# Patient Record
Sex: Male | Born: 1975 | Race: White | Hispanic: No | Marital: Married | Smoking: Never smoker
Health system: Southern US, Community
[De-identification: ages and names within clinical notes are randomized; demographics above are authoritative.]

## PROBLEM LIST (undated history)

## (undated) DIAGNOSIS — F1020 Alcohol dependence, uncomplicated: Secondary | ICD-10-CM

---

## 2007-04-29 ENCOUNTER — Encounter: Admission: RE | Admit: 2007-04-29 | Discharge: 2007-04-29 | Payer: Self-pay | Admitting: Gastroenterology

## 2007-06-03 ENCOUNTER — Encounter: Admission: RE | Admit: 2007-06-03 | Discharge: 2007-06-03 | Payer: Self-pay | Admitting: Gastroenterology

## 2007-06-23 ENCOUNTER — Ambulatory Visit (HOSPITAL_COMMUNITY): Admission: RE | Admit: 2007-06-23 | Discharge: 2007-06-23 | Payer: Self-pay | Admitting: Urology

## 2007-07-27 ENCOUNTER — Encounter: Admission: RE | Admit: 2007-07-27 | Discharge: 2007-10-25 | Payer: Self-pay | Admitting: Family Medicine

## 2010-08-12 ENCOUNTER — Encounter: Payer: Self-pay | Admitting: Gastroenterology

## 2011-04-29 LAB — CREATININE, SERUM
Creatinine, Ser: 1.06
GFR calc Af Amer: >60
GFR calc non Af Amer: >60

## 2014-04-14 ENCOUNTER — Other Ambulatory Visit: Payer: Self-pay | Admitting: Otolaryngology

## 2014-04-14 DIAGNOSIS — K219 Gastro-esophageal reflux disease without esophagitis: Principal | ICD-10-CM

## 2014-04-14 DIAGNOSIS — IMO0001 Reserved for inherently not codable concepts without codable children: Secondary | ICD-10-CM

## 2014-04-20 ENCOUNTER — Other Ambulatory Visit: Payer: Self-pay

## 2014-04-21 ENCOUNTER — Ambulatory Visit
Admission: RE | Admit: 2014-04-21 | Discharge: 2014-04-21 | Disposition: A | Discharge status: Home / Self Care | Payer: Federal, State, Local not specified - PPO | Source: Ambulatory Visit | Attending: Otolaryngology | Admitting: Otolaryngology

## 2014-04-21 DIAGNOSIS — IMO0001 Reserved for inherently not codable concepts without codable children: Secondary | ICD-10-CM

## 2014-04-21 DIAGNOSIS — K219 Gastro-esophageal reflux disease without esophagitis: Principal | ICD-10-CM

## 2014-05-13 ENCOUNTER — Other Ambulatory Visit: Payer: Self-pay | Admitting: Otolaryngology

## 2014-05-13 DIAGNOSIS — R070 Pain in throat: Secondary | ICD-10-CM

## 2014-05-18 ENCOUNTER — Ambulatory Visit
Admission: RE | Admit: 2014-05-18 | Discharge: 2014-05-18 | Disposition: A | Discharge status: Home / Self Care | Payer: Federal, State, Local not specified - PPO | Source: Ambulatory Visit | Attending: Otolaryngology | Admitting: Otolaryngology

## 2014-05-18 DIAGNOSIS — R070 Pain in throat: Secondary | ICD-10-CM

## 2014-05-18 MED ORDER — IOHEXOL 300 MG/ML  SOLN
75.0000 mL | Freq: Once | INTRAMUSCULAR | Status: AC | PRN
  Administered 2014-05-18: 75 mL via INTRAVENOUS

## 2015-02-05 IMAGING — CT CT NECK W/ CM
4 of 6 series · 14 of 33 positions shown, 16 images · IV contrast (75ML OF ISOVUE)
Comparison: None.

CLINICAL DATA: Possible mass. Abnormal barium swallow. Area of
concern marked with vitamin-E capsule.

EXAM:
CT NECK WITH CONTRAST
TECHNIQUE: Multidetector CT imaging of the neck was performed using the
standard protocol following the bolus administration of intravenous
contrast.
CONTRAST:  75mL OMNIPAQUE IOHEXOL 300 MG/ML  SOLN

[Series 3: axial neck · axial · 0.45mm/px · z∈[+43,+133]mm · 2 of 109 slices shown]
[im 37/109  bone]
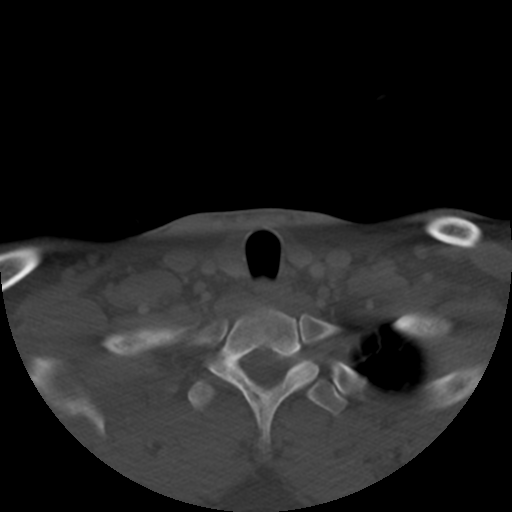
[im 73/109  bone]
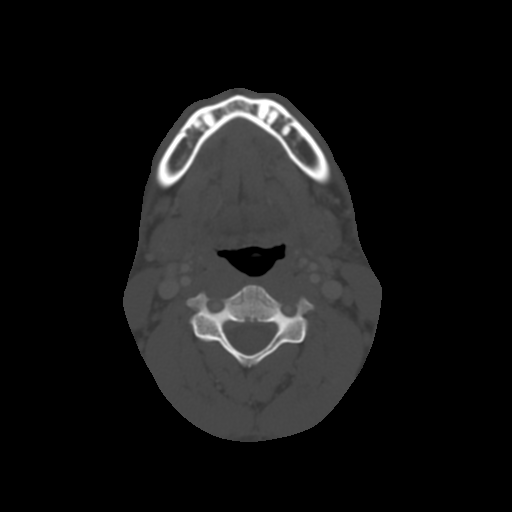

[Series 200: cor · coronal · 0.54mm/px · 3 of 95 slices shown]
[im 19/95  bone]
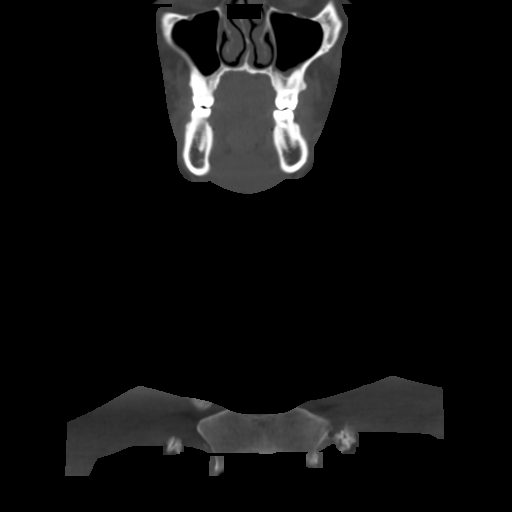
[im 38/95  bone]
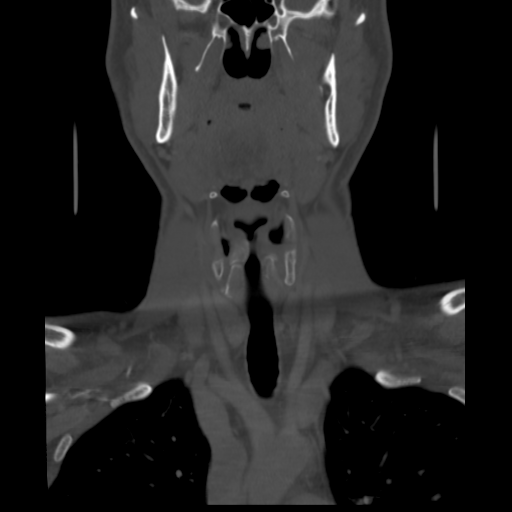
[im 57/95  bone]
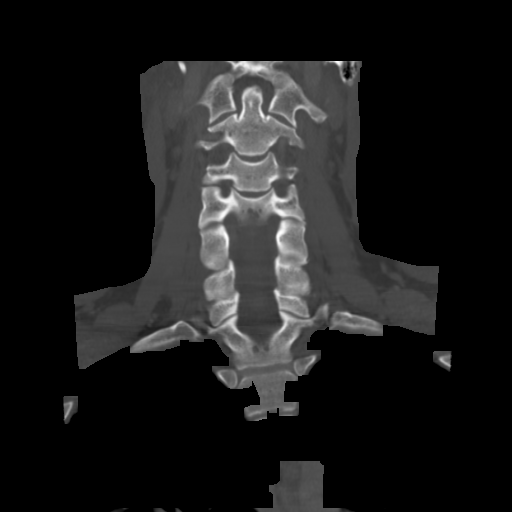

[Series 201: axial · axial · 0.54mm/px · z∈[-31,+142]mm · 4 of 151 slices shown, 5 images]
[im 31/151  soft-tissue]
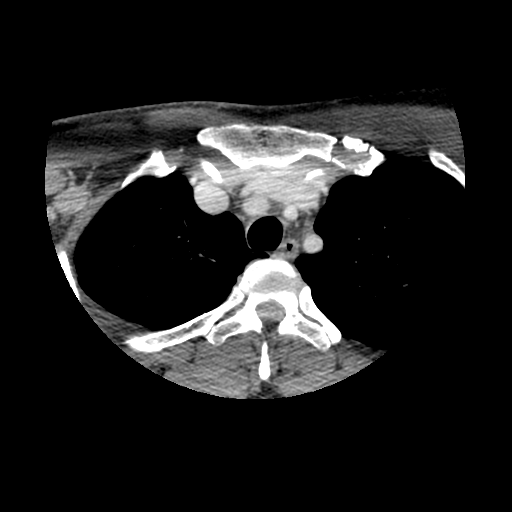
[im 31/151  bone]
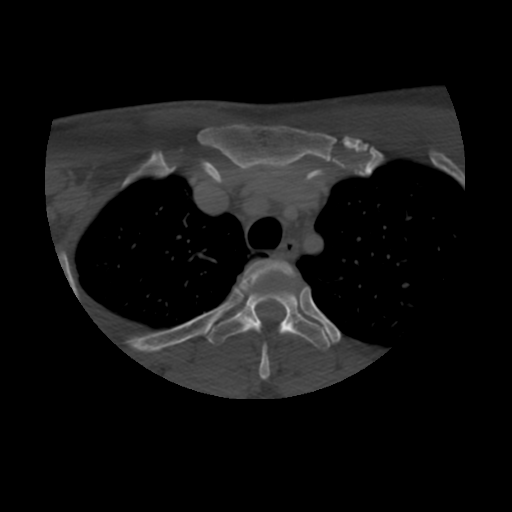
[im 61/151  bone]
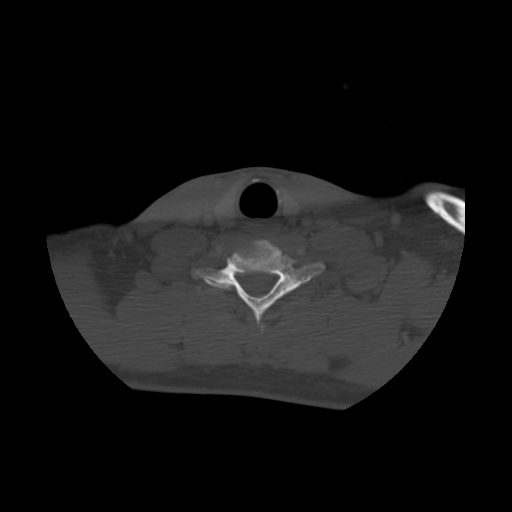
[im 91/151  bone]
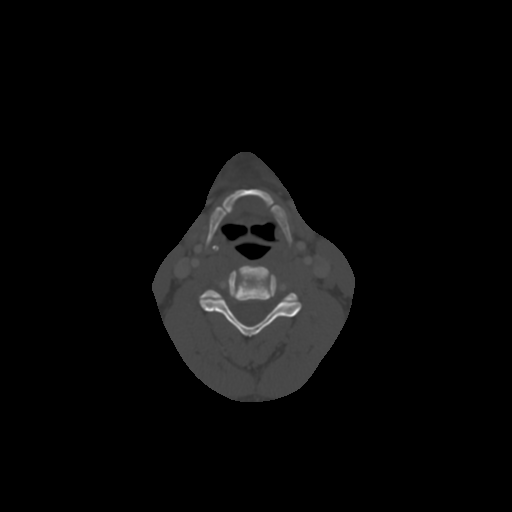
[im 121/151  bone]
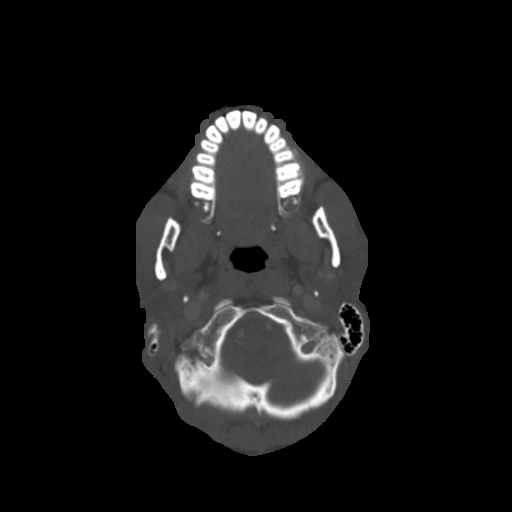

[Series 202: sag · sagittal · 0.54mm/px · 5 of 89 slices shown, 6 images]
[im 30/89  bone]
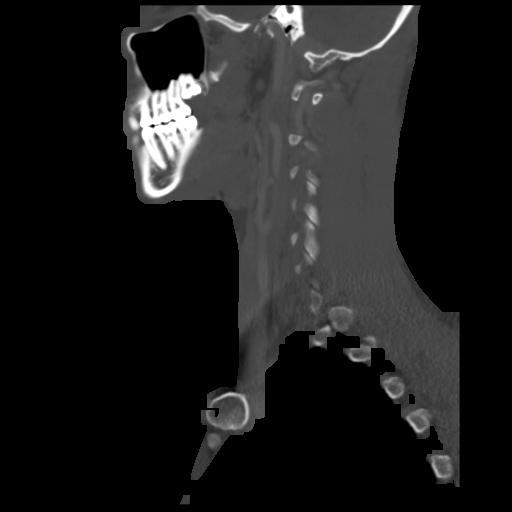
[im 37/89  bone]
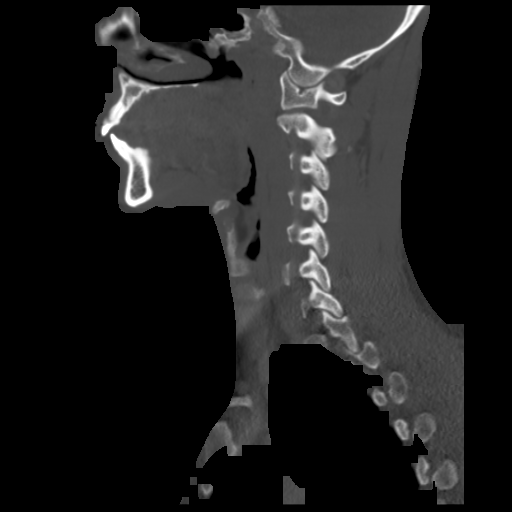
[im 45/89  soft-tissue]
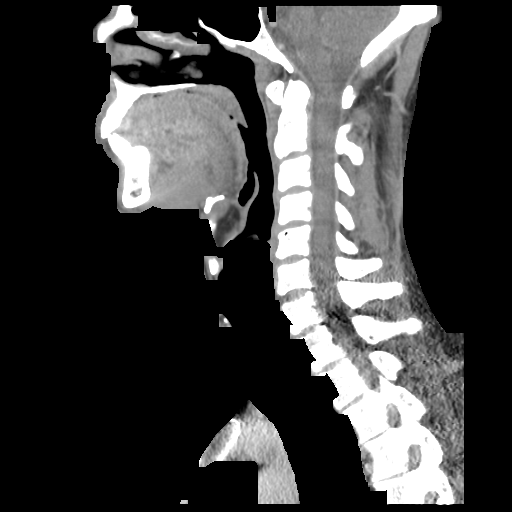
[im 45/89  bone]
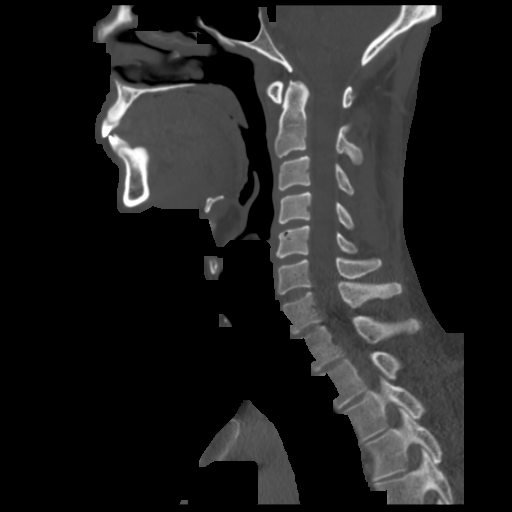
[im 52/89  bone]
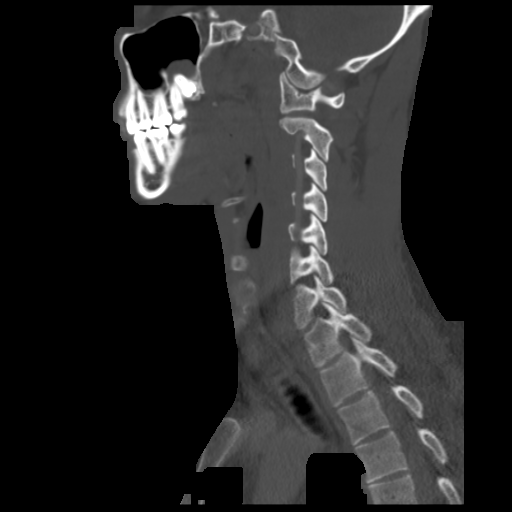
[im 59/89  bone]
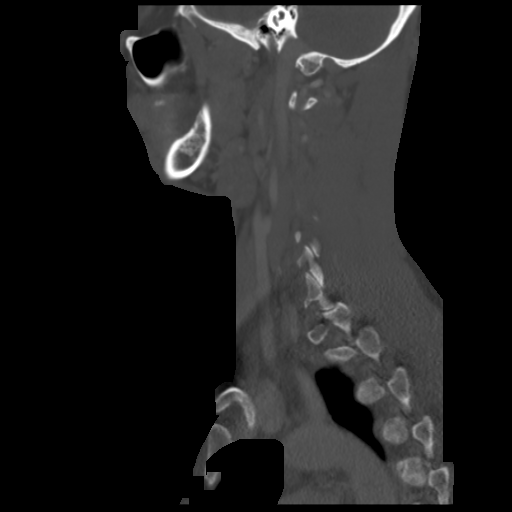

[14 of 33 positions shown; findings below may reference images not displayed]

FINDINGS: Suprahyoid neck:  Normal.

Larynx:  Normal.

Infrahyoid neck:  Normal.

Lymph nodes:  No pathologic adenopathy.

Upper chest/mediastinum:  Negative.

Additional: Cervical spine unremarkable. No vascular anomaly. No
osseous lesion. Chronic sinus disease.
IMPRESSION: Unremarkable CT neck. No abnormality is seen which might explain a
defect in the esophageal contour on barium swallow.

## 2017-06-17 DIAGNOSIS — J302 Other seasonal allergic rhinitis: Secondary | ICD-10-CM | POA: Diagnosis not present

## 2017-06-17 DIAGNOSIS — J4521 Mild intermittent asthma with (acute) exacerbation: Secondary | ICD-10-CM | POA: Diagnosis not present

## 2017-06-17 DIAGNOSIS — J018 Other acute sinusitis: Secondary | ICD-10-CM | POA: Diagnosis not present

## 2017-06-17 DIAGNOSIS — R05 Cough: Secondary | ICD-10-CM | POA: Diagnosis not present

## 2018-08-24 DIAGNOSIS — M545 Low back pain: Secondary | ICD-10-CM | POA: Diagnosis not present

## 2018-08-26 DIAGNOSIS — M5416 Radiculopathy, lumbar region: Secondary | ICD-10-CM | POA: Diagnosis not present

## 2018-09-17 DIAGNOSIS — M545 Low back pain: Secondary | ICD-10-CM | POA: Diagnosis not present

## 2018-12-25 DIAGNOSIS — Z0000 Encounter for general adult medical examination without abnormal findings: Secondary | ICD-10-CM | POA: Diagnosis not present

## 2019-01-26 DIAGNOSIS — Z13220 Encounter for screening for lipoid disorders: Secondary | ICD-10-CM | POA: Diagnosis not present

## 2019-01-26 DIAGNOSIS — Z0000 Encounter for general adult medical examination without abnormal findings: Secondary | ICD-10-CM | POA: Diagnosis not present

## 2019-01-26 DIAGNOSIS — Z23 Encounter for immunization: Secondary | ICD-10-CM | POA: Diagnosis not present

## 2020-02-25 DIAGNOSIS — Z20822 Contact with and (suspected) exposure to covid-19: Secondary | ICD-10-CM | POA: Diagnosis not present

## 2020-03-06 ENCOUNTER — Encounter (HOSPITAL_COMMUNITY): Payer: Self-pay

## 2020-03-06 ENCOUNTER — Inpatient Hospital Stay (HOSPITAL_COMMUNITY)
Admission: EM | Admit: 2020-03-06 | Discharge: 2020-03-10 | DRG: 177 | Disposition: A | Discharge status: Home / Self Care | Payer: Federal, State, Local not specified - PPO | Attending: Internal Medicine | Admitting: Internal Medicine

## 2020-03-06 ENCOUNTER — Other Ambulatory Visit: Payer: Self-pay

## 2020-03-06 ENCOUNTER — Emergency Department (HOSPITAL_COMMUNITY): Payer: Federal, State, Local not specified - PPO

## 2020-03-06 DIAGNOSIS — J9601 Acute respiratory failure with hypoxia: Secondary | ICD-10-CM | POA: Diagnosis present

## 2020-03-06 DIAGNOSIS — Z881 Allergy status to other antibiotic agents status: Secondary | ICD-10-CM

## 2020-03-06 DIAGNOSIS — Z79899 Other long term (current) drug therapy: Secondary | ICD-10-CM | POA: Diagnosis not present

## 2020-03-06 DIAGNOSIS — J1282 Pneumonia due to coronavirus disease 2019: Secondary | ICD-10-CM | POA: Diagnosis present

## 2020-03-06 DIAGNOSIS — J9600 Acute respiratory failure, unspecified whether with hypoxia or hypercapnia: Secondary | ICD-10-CM | POA: Diagnosis not present

## 2020-03-06 DIAGNOSIS — J9811 Atelectasis: Secondary | ICD-10-CM | POA: Diagnosis not present

## 2020-03-06 DIAGNOSIS — R0602 Shortness of breath: Secondary | ICD-10-CM | POA: Diagnosis not present

## 2020-03-06 DIAGNOSIS — R000 Tachycardia, unspecified: Secondary | ICD-10-CM | POA: Diagnosis not present

## 2020-03-06 DIAGNOSIS — U071 COVID-19: Secondary | ICD-10-CM | POA: Diagnosis not present

## 2020-03-06 DIAGNOSIS — R7989 Other specified abnormal findings of blood chemistry: Secondary | ICD-10-CM | POA: Diagnosis not present

## 2020-03-06 DIAGNOSIS — J069 Acute upper respiratory infection, unspecified: Secondary | ICD-10-CM | POA: Diagnosis present

## 2020-03-06 LAB — BASIC METABOLIC PANEL
Anion gap: 12 (ref 5–15)
BUN: 8 mg/dL (ref 6–20)
CO2: 27 mmol/L (ref 22–32)
Calcium: 9 mg/dL (ref 8.9–10.3)
Chloride: 96 mmol/L — ABNORMAL LOW (ref 98–111)
Creatinine, Ser: 0.95 mg/dL (ref 0.61–1.24)
GFR calc Af Amer: >60 mL/min (ref >60)
GFR calc non Af Amer: >60 mL/min (ref >60)
Glucose, Bld: 122 mg/dL — ABNORMAL HIGH (ref 70–99)
Potassium: 3.8 mmol/L (ref 3.5–5.1)
Sodium: 135 mmol/L (ref 135–145)

## 2020-03-06 LAB — HEPATIC FUNCTION PANEL
ALT: 253 U/L — ABNORMAL HIGH (ref 0–44)
AST: 257 U/L — ABNORMAL HIGH (ref 15–41)
Albumin: 3.4 g/dL — ABNORMAL LOW (ref 3.5–5.0)
Alkaline Phosphatase: 92 U/L (ref 38–126)
Bilirubin, Direct: 0.3 mg/dL — ABNORMAL HIGH (ref 0.0–0.2)
Indirect Bilirubin: 0.9 mg/dL (ref 0.3–0.9)
Total Bilirubin: 1.2 mg/dL (ref 0.3–1.2)
Total Protein: 6.7 g/dL (ref 6.5–8.1)

## 2020-03-06 LAB — CBC
HCT: 47.3 % (ref 39.0–52.0)
Hemoglobin: 16.7 g/dL (ref 13.0–17.0)
MCH: 30.9 pg (ref 26.0–34.0)
MCHC: 35.3 g/dL (ref 30.0–36.0)
MCV: 87.4 fL (ref 80.0–100.0)
Platelets: 175 10*3/uL (ref 150–400)
RBC: 5.41 MIL/uL (ref 4.22–5.81)
RDW: 12 % (ref 11.5–15.5)
WBC: 7.2 10*3/uL (ref 4.0–10.5)
nRBC: 0 % (ref 0.0–0.2)

## 2020-03-06 LAB — LACTIC ACID, PLASMA: Lactic Acid, Venous: 1.6 mmol/L (ref 0.5–1.9)

## 2020-03-06 LAB — FIBRINOGEN: Fibrinogen: 668 mg/dL — ABNORMAL HIGH (ref 210–475)

## 2020-03-06 LAB — C-REACTIVE PROTEIN: CRP: 8.6 mg/dL — ABNORMAL HIGH (ref <1.0)

## 2020-03-06 LAB — SARS CORONAVIRUS 2 BY RT PCR (HOSPITAL ORDER, PERFORMED IN ~~LOC~~ HOSPITAL LAB): SARS Coronavirus 2: POSITIVE — AB

## 2020-03-06 LAB — FERRITIN: Ferritin: >7500 ng/mL — ABNORMAL HIGH (ref 24–336)

## 2020-03-06 LAB — TROPONIN I (HIGH SENSITIVITY)
Troponin I (High Sensitivity): 11 ng/L (ref <18)
Troponin I (High Sensitivity): 9 ng/L (ref <18)

## 2020-03-06 LAB — LACTATE DEHYDROGENASE: LDH: 683 U/L — ABNORMAL HIGH (ref 98–192)

## 2020-03-06 LAB — PROCALCITONIN: Procalcitonin: 0.14 ng/mL

## 2020-03-06 LAB — D-DIMER, QUANTITATIVE: D-Dimer, Quant: 1.34 ug/mL-FEU — ABNORMAL HIGH (ref 0.00–0.50)

## 2020-03-06 LAB — TRIGLYCERIDES: Triglycerides: 64 mg/dL (ref <150)

## 2020-03-06 MED ORDER — SODIUM CHLORIDE 0.9 % IV SOLN
100.0000 mg | INTRAVENOUS | Status: AC
  Administered 2020-03-07 – 2020-03-10 (×4): 100 mg via INTRAVENOUS
  Filled 2020-03-06 (×5): qty 20

## 2020-03-06 MED ORDER — DEXAMETHASONE SODIUM PHOSPHATE 10 MG/ML IJ SOLN
10.0000 mg | Freq: Once | INTRAMUSCULAR | Status: AC
  Administered 2020-03-06: 10 mg via INTRAVENOUS
  Filled 2020-03-06: qty 1

## 2020-03-06 MED ORDER — SODIUM CHLORIDE 0.9 % IV SOLN
200.0000 mg | Freq: Once | INTRAVENOUS | Status: AC
  Administered 2020-03-06: 200 mg via INTRAVENOUS
  Filled 2020-03-06: qty 200

## 2020-03-06 MED ORDER — ONDANSETRON HCL 4 MG PO TABS: 4.0000 mg | ORAL_TABLET | Freq: Four times a day (QID) | ORAL | Status: DC | PRN

## 2020-03-06 MED ORDER — ONDANSETRON HCL 4 MG/2ML IJ SOLN: 4.0000 mg | Freq: Four times a day (QID) | INTRAMUSCULAR | Status: DC | PRN

## 2020-03-06 MED ORDER — ENOXAPARIN SODIUM 40 MG/0.4ML ~~LOC~~ SOLN
40.0000 mg | Freq: Every day | SUBCUTANEOUS | Status: DC
  Administered 2020-03-07 – 2020-03-10 (×4): 40 mg via SUBCUTANEOUS
  Filled 2020-03-06 (×4): qty 0.4

## 2020-03-06 MED ORDER — METHYLPREDNISOLONE SODIUM SUCC 125 MG IJ SOLR
50.0000 mg | Freq: Two times a day (BID) | INTRAMUSCULAR | Status: DC
  Administered 2020-03-07 – 2020-03-10 (×7): 50 mg via INTRAVENOUS
  Filled 2020-03-06 (×7): qty 2

## 2020-03-06 MED ORDER — ACETAMINOPHEN 325 MG PO TABS
650.0000 mg | ORAL_TABLET | Freq: Once | ORAL | Status: AC
  Administered 2020-03-06: 650 mg via ORAL
  Filled 2020-03-06: qty 2

## 2020-03-06 NOTE — ED Triage Notes (Signed)
Pt arrives to ED w/ c/o SOB worse w/ exertion. Pt states he tested positive for covid 8/3 and symptoms have worsened over the last few days. Resp e/u at rest, spo2 97% RA. Pt denies chest pain.

## 2020-03-06 NOTE — ED Notes (Signed)
X RAY at bedside 

## 2020-03-06 NOTE — ED Provider Notes (Signed)
[facility] EMERGENCY DEPARTMENT Provider Note   CSN: [id number] Arrival date & time: 03/06/20  1352     History Chief Complaint  Patient presents with  . Shortness of Breath    Brad Peterson is a 44 y.o. male.  The history is provided by the patient.  Shortness of Breath Severity:  Moderate Onset quality:  Gradual Duration:  4 days Timing:  Constant Progression:  Worsening Chronicity:  New Context: URI (COVID for 12-13 days)   Relieved by:  Nothing Worsened by:  Deep breathing and exertion Associated symptoms: cough and fever   Associated symptoms: no abdominal pain, no chest pain, no ear pain, no rash, no sore throat and no vomiting        History reviewed. No pertinent past medical history.  There are no problems to display for this patient.   History reviewed. No pertinent surgical history.     No family history on file.  Social History   Tobacco Use  . Smoking status: Not on file  Substance Use Topics  . Alcohol use: Not on file  . Drug use: Not on file    Home Medications Prior to Admission medications   Medication Sig Start Date End Date Taking? Authorizing Provider  GUAIFENESIN PO Take 2 tablets by mouth as needed (cough and cold).   Yes [provider]    Allergies    Amoxicillin  Review of Systems   Review of Systems  Constitutional: Positive for fever. Negative for chills.  HENT: Negative for ear pain and sore throat.   Eyes: Negative for pain and visual disturbance.  Respiratory: Positive for cough and shortness of breath.   Cardiovascular: Negative for chest pain and palpitations.  Gastrointestinal: Negative for abdominal pain and vomiting.  Genitourinary: Negative for dysuria and hematuria.  Musculoskeletal: Negative for arthralgias and back pain.  Skin: Negative for color change and rash.  Neurological: Negative for seizures and syncope.  All other systems reviewed and are negative.   Physical  Exam Updated Vital Signs  ED Triage Vitals  Enc Vitals Group     BP 03/06/20 1429 136/83     Pulse Rate 03/06/20 1429 (!) 102     Resp 03/06/20 1429 (!) 22     Temp 03/06/20 1429 (!) 100.5 F (38.1 C)     Temp Source 03/06/20 1429 Oral     SpO2 03/06/20 1429 93 %     Weight --      Height --      Head Circumference --      Peak Flow --      Pain Score 03/06/20 1432 0     Pain Loc --      Pain Edu? --      Excl. in GC? --     Physical Exam Vitals and nursing note reviewed.  Constitutional:      General: He is not in acute distress.    Appearance: He is well-developed. He is not ill-appearing.  HENT:     Head: Normocephalic and atraumatic.  Eyes:     Extraocular Movements: Extraocular movements intact.     Conjunctiva/sclera: Conjunctivae normal.     Pupils: Pupils are equal, round, and reactive to light.  Cardiovascular:     Rate and Rhythm: Normal rate and regular rhythm.     Pulses: Normal pulses.     Heart sounds: Normal heart sounds. No murmur heard.   Pulmonary:     Effort: Pulmonary effort is normal.  Tachypnea present. No respiratory distress.     Breath sounds: Decreased breath sounds present.     Comments: Coarse breath sounds  Abdominal:     Palpations: Abdomen is soft.     Tenderness: There is no abdominal tenderness.  Musculoskeletal:        General: Normal range of motion.     Cervical back: Normal range of motion and neck supple.     Right lower leg: No edema.  Skin:    General: Skin is warm and dry.     Capillary Refill: Capillary refill takes less than 2 seconds.  Neurological:     General: No focal deficit present.     Mental Status: He is alert.  Psychiatric:        Mood and Affect: Mood normal.     ED Results / Procedures / Treatments   Labs (all labs ordered are listed, but only abnormal results are displayed) Labs Reviewed  BASIC METABOLIC PANEL - Abnormal; Notable for the following components:      Result Value   Chloride 96 (*)      Glucose, Bld 122 (*)    All other components within normal limits  D-DIMER, QUANTITATIVE (NOT AT [facility]) - Abnormal; Notable for the following components:   D-Dimer, Quant 1.34 (*)    All other components within normal limits  LACTATE DEHYDROGENASE - Abnormal; Notable for the following components:   LDH 683 (*)    All other components within normal limits  FIBRINOGEN - Abnormal; Notable for the following components:   Fibrinogen 668 (*)    All other components within normal limits  HEPATIC FUNCTION PANEL - Abnormal; Notable for the following components:   Albumin 3.4 (*)    AST 257 (*)    ALT 253 (*)    Bilirubin, Direct 0.3 (*)    All other components within normal limits  SARS CORONAVIRUS 2 BY RT PCR (HOSPITAL ORDER, PERFORMED IN [facility])  CULTURE, BLOOD (ROUTINE X 2)  CULTURE, BLOOD (ROUTINE X 2)  CBC  LACTIC ACID, PLASMA  TRIGLYCERIDES  LACTIC ACID, PLASMA  PROCALCITONIN  FERRITIN  C-REACTIVE PROTEIN  TROPONIN I (HIGH SENSITIVITY)  TROPONIN I (HIGH SENSITIVITY)    EKG EKG Interpretation  Date/Time:  Monday March 06 2020 14:31:48 EDT Ventricular Rate:  110 PR Interval:  124 QRS Duration: 80 QT Interval:  332 QTC Calculation: 449 R Axis:   95 Text Interpretation: Sinus tachycardia Rightward axis Borderline ECG Confirmed by Virgina Norfolk [phone]) on 03/06/2020 6:22:42 PM   Radiology DG Chest Portable 1 View  Result Date: 03/06/2020 CLINICAL DATA:  Shortness of breath. EXAM: PORTABLE CHEST 1 VIEW COMPARISON:  None. FINDINGS: Mild atelectasis is seen within the bilateral lung bases. There is no evidence of acute infiltrate, pleural effusion or pneumothorax. The heart size and mediastinal contours are within normal limits. The visualized skeletal structures are unremarkable. IMPRESSION: Mild bibasilar atelectasis. Electronically Signed   By: Aram Candela M.D.   On: 03/06/2020 19:02    Procedures .Critical Care Performed by: Virgina Norfolk,  DO Authorized by: Virgina Norfolk, DO   Critical care provider statement:    Critical care time (minutes):  45   Critical care was necessary to treat or prevent imminent or life-threatening deterioration of the following conditions:  Respiratory failure   Critical care was time spent personally by me on the following activities:  Blood draw for specimens, development of treatment plan with patient or surrogate, discussions with consultants, evaluation of  patient's response to treatment, examination of patient, ordering and performing treatments and interventions, ordering and review of laboratory studies, ordering and review of radiographic studies, pulse oximetry, re-evaluation of patient's condition, review of old charts, interpretation of cardiac output measurements and obtaining history from patient or surrogate   I assumed direction of critical care for this patient from another provider in my specialty: no     (including critical care time)  Medications Ordered in ED Medications  remdesivir 200 mg in sodium chloride 0.9% 250 mL IVPB (has no administration in time range)    Followed by  remdesivir 100 mg in sodium chloride 0.9 % 100 mL IVPB (has no administration in time range)  acetaminophen (TYLENOL) tablet 650 mg (650 mg Oral Given 03/06/20 2003)  dexamethasone (DECADRON) injection 10 mg (10 mg Intravenous Given 03/06/20 2010)    ED Course  I have reviewed the triage vital signs and the nursing notes.  Pertinent labs & imaging results that were available during my care of the patient were reviewed by me and considered in my medical decision making (see chart for details).    MDM Rules/Calculators/A&P                          Brad Peterson is a 44 year old male who presents to the ED with fever, shortness of breath.  Covid positive several days ago.  Has had symptoms for almost 2 weeks.  However, shortness of breath or respiratory symptoms worse over the last 4 days.  Patient  arrives febrile but otherwise normal vitals.  Chest x-ray with some Covid type changes.  Inflammatory markers elevated.  Troponin normal.  D-dimer elevated.  Given hypoxia and coronavirus will treat with IV antiviral and steroids and Tylenol.  Will admit to medicine for further care.  This chart was dictated using voice recognition software.  Despite best efforts to proofread,  errors can occur which can change the documentation meaning.  Brad Peterson was evaluated in Emergency Department on 03/06/2020 for the symptoms described in the history of present illness. He was evaluated in the context of the global COVID-19 pandemic, which necessitated consideration that the patient might be at risk for infection with the SARS-CoV-2 virus that causes COVID-19. Institutional protocols and algorithms that pertain to the evaluation of patients at risk for COVID-19 are in a state of rapid change based on information released by regulatory bodies including the CDC and federal and state organizations. These policies and algorithms were followed during the patient's care in the ED.   Final Clinical Impression(s) / ED Diagnoses Final diagnoses:  Acute respiratory failure with hypoxia [facility])  COVID-19    Rx / DC Orders ED Discharge Orders    None       Virgina Norfolk, DO 03/06/20 2117

## 2020-03-06 NOTE — H&P (Signed)
History and Physical    Brad Peterson XTK:[id number] DOB: [birth year] DOA: 03/06/2020  PCP: Clementeen Graham, PA-C  Patient coming from: Home.  Chief Complaint: Shortness of breath.  HPI: Brad Peterson is a 44 y.o. male with no significant past medical history presents to the ER with complaints of shortness of breath.  Patient states his wife was diagnosed with COVID-19 infection and he started developing upper respiratory tract symptoms on August 3.  On August 6 patient was diagnosed with Covid infection at an outpatient facility.  Since then patient has progressively got short of breath mostly on exertion with nonproductive cough and pleuritic type of chest pain.  Given the worsening symptoms patient presents to the ER.  Patient was taken off and on ibuprofen and Tylenol for the symptoms.  ED Course: In the ER patient had a temperature of 101.3 F hypoxic requiring 2 L oxygen chest x-ray showing bilateral infiltrates.  Labs are significant for markedly elevated LFTs AST was 257 ALT 253 total bilirubin was 1.2.  Ferritin more than 7500 CRP 8.6 procalcitonin 1.14 D-dimer 1.24.  Patient started on IV remdesivir and steroids admitted for acute respiratory failure with hypoxia secondary to Covid infection.  Review of Systems: As per HPI, rest all negative.   History reviewed. No pertinent past medical history.  History reviewed. No pertinent surgical history.   reports that he has never smoked. He has never used smokeless tobacco. He reports current alcohol use. He reports that he does not use drugs.  Allergies  Allergen Reactions  . Amoxicillin Rash    Family History  Problem Relation Age of Onset  . Diabetes Mellitus II Neg Hx     Prior to Admission medications   Medication Sig Start Date End Date Taking? Authorizing Provider  GUAIFENESIN PO Take 2 tablets by mouth as needed (cough and cold).   Yes [provider]    Physical Exam: Constitutional: Moderately  built and nourished. Vitals:   03/06/20 2114 03/06/20 2116 03/06/20 2215 03/06/20 2300  BP:   122/79 120/86  Pulse:   82 84  Resp:   (!) 22 (!) 22  Temp:  98.7 F (37.1 C)    TempSrc:  Oral    SpO2:   94% 94%  Weight: 97.5 kg     Height: 6\' 2"  (1.88 m)      Eyes: Anicteric no pallor. ENMT: No discharge from the ears eyes nose or mouth. Neck: No mass felt.  No neck rigidity. Respiratory: No rhonchi or crepitations. Cardiovascular: S1-S2 heard. Abdomen: Soft nontender bowel sounds present. Musculoskeletal: No edema. Skin: No rash. Neurologic: Alert awake oriented to time place and person.  Moves all extremities 5 x 5. Psychiatric: Appears normal.  Normal affect.   Labs on Admission: I have personally reviewed following labs and imaging studies  CBC: Recent Labs  Lab 03/06/20 1439  WBC 7.2  HGB 16.7  HCT 47.3  MCV 87.4  PLT 175   Basic Metabolic Panel: Recent Labs  Lab 03/06/20 1439  NA 135  K 3.8  CL 96*  CO2 27  GLUCOSE 122*  BUN 8  CREATININE 0.95  CALCIUM 9.0   GFR: Estimated Creatinine Clearance: 115.4 mL/min (by C-G formula based on SCr of 0.95 mg/dL). Liver Function Tests: Recent Labs  Lab 03/06/20 1956  AST 257*  ALT 253*  ALKPHOS 92  BILITOT 1.2  PROT 6.7  ALBUMIN 3.4*   No results for input(s): LIPASE, AMYLASE in the last 168 hours. No  results for input(s): AMMONIA in the last 168 hours. Coagulation Profile: No results for input(s): INR, PROTIME in the last 168 hours. Cardiac Enzymes: No results for input(s): CKTOTAL, CKMB, CKMBINDEX, TROPONINI in the last 168 hours. BNP (last 3 results) No results for input(s): PROBNP in the last 8760 hours. HbA1C: No results for input(s): HGBA1C in the last 72 hours. CBG: No results for input(s): GLUCAP in the last 168 hours. Lipid Profile: Recent Labs    03/06/20 1956  TRIG 64   Thyroid Function Tests: No results for input(s): TSH, T4TOTAL, FREET4, T3FREE, THYROIDAB in the last 72  hours. Anemia Panel: Recent Labs    03/06/20 1956  FERRITIN >7,500*   Urine analysis: No results found for: COLORURINE, APPEARANCEUR, LABSPEC, PHURINE, GLUCOSEU, HGBUR, BILIRUBINUR, KETONESUR, PROTEINUR, UROBILINOGEN, NITRITE, LEUKOCYTESUR Sepsis Labs: @LABRCNTIP (procalcitonin:4,lacticidven:4) ) Recent Results (from the past 240 hour(s))  SARS Coronavirus 2 by RT PCR (hospital order, performed in [facility] hospital lab) Nasopharyngeal Nasopharyngeal Swab     Status: Abnormal   Collection Time: 03/06/20  7:56 PM   Specimen: Nasopharyngeal Swab  Result Value Ref Range Status   SARS Coronavirus 2 POSITIVE (A) NEGATIVE Final    Comment: RESULT CALLED TO, READ BACK BY AND VERIFIED WITH: A COLEMAN RN 2222 03/06/20 A BROWNING (NOTE) SARS-CoV-2 target nucleic acids are DETECTED  SARS-CoV-2 RNA is generally detectable in upper respiratory specimens  during the acute phase of infection.  Positive results are indicative  of the presence of the identified virus, but do not rule out bacterial infection or co-infection with other pathogens not detected by the test.  Clinical correlation with patient history and  other diagnostic information is necessary to determine patient infection status.  The expected result is negative.  Fact Sheet for Patients:   03/08/20   Fact Sheet for Healthcare Providers:   BoilerBrush.com.cy    This test is not yet approved or cleared by the https://pope.com/ FDA and  has been authorized for detection and/or diagnosis of SARS-CoV-2 by FDA under an Emergency Use Authorization (EUA).  This EUA will remain in effect (meaning this  test can be used) for the duration of  the COVID-19 declaration under Section 564(b)(1) of the Act, 21 U.S.C. section 360-bbb-3(b)(1), unless the authorization is terminated or revoked sooner.  Performed at [facility], [address], [place], [place]       Radiological Exams on Admission: DG Chest Portable 1 View  Result Date: 03/06/2020 CLINICAL DATA:  Shortness of breath. EXAM: PORTABLE CHEST 1 VIEW COMPARISON:  None. FINDINGS: Mild atelectasis is seen within the bilateral lung bases. There is no evidence of acute infiltrate, pleural effusion or pneumothorax. The heart size and mediastinal contours are within normal limits. The visualized skeletal structures are unremarkable. IMPRESSION: Mild bibasilar atelectasis. Electronically Signed   By: 03/08/2020 M.D.   On: 03/06/2020 19:02    EKG: Independently reviewed.  Sinus tachycardia.  Assessment/Plan Principal Problem:   Acute respiratory failure due to COVID-19 [facility]) Active Problems:   Elevated LFTs   Acute respiratory disease due to COVID-19 virus    1. Acute respiratory failure with hypoxia secondary to COVID-19 infection for which I have started patient on IV remdesivir and IV Solu-Medrol.  Follow inflammatory markers and respiratory status.  I did discuss with patient about off label use of Actemra is side effects and its contraindication if patient may needed if patient becomes more hypoxic but at this time patient would like to reconsider it and  discuss abortive treatment if needed.  Has not consented for Actemra yet. 2. Elevated LFTs abdomen appears benign denies nausea vomiting.  Differentials include possibly from alcoholism or could be from Covid infection.  I have ordered acute hepatitis panel Tylenol levels.  If LFTs worsen may need further additional tests including imaging. 3. Patient states he drinks alcohol every day for which I have placed patient on CIWA protocol.  Advised about quitting.  Given that patient is acute respiratory failure with hypoxia and Covid infection will need close monitoring for any further deterioration in inpatient status.   DVT prophylaxis: Lovenox.  Check INR given elevated LFTs. Code Status: Full code. Family Communication: Discussed with  patient. Disposition Plan: Home. Consults called: None. Admission status: Inpatient.   Eduard Clos MD [facility] Pager [phone].  If 7PM-7AM, please contact night-coverage [email or web address] Password [facility]  03/06/2020, 11:46 PM

## 2020-03-07 DIAGNOSIS — J9600 Acute respiratory failure, unspecified whether with hypoxia or hypercapnia: Secondary | ICD-10-CM | POA: Diagnosis not present

## 2020-03-07 DIAGNOSIS — U071 COVID-19: Secondary | ICD-10-CM | POA: Diagnosis not present

## 2020-03-07 LAB — CBC
HCT: 42.8 % (ref 39.0–52.0)
Hemoglobin: 15 g/dL (ref 13.0–17.0)
MCH: 30.4 pg (ref 26.0–34.0)
MCHC: 35 g/dL (ref 30.0–36.0)
MCV: 86.8 fL (ref 80.0–100.0)
Platelets: 169 10*3/uL (ref 150–400)
RBC: 4.93 MIL/uL (ref 4.22–5.81)
RDW: 11.9 % (ref 11.5–15.5)
WBC: 6.7 10*3/uL (ref 4.0–10.5)
nRBC: 0 % (ref 0.0–0.2)

## 2020-03-07 LAB — CREATININE, SERUM
Creatinine, Ser: 0.88 mg/dL (ref 0.61–1.24)
GFR calc Af Amer: >60 mL/min (ref >60)
GFR calc non Af Amer: >60 mL/min (ref >60)

## 2020-03-07 LAB — HEPATITIS PANEL, ACUTE
HCV Ab: NONREACTIVE
Hep A IgM: NONREACTIVE
Hep B C IgM: NONREACTIVE
Hepatitis B Surface Ag: NONREACTIVE

## 2020-03-07 LAB — ABO/RH: ABO/RH(D): O POS

## 2020-03-07 LAB — ACETAMINOPHEN LEVEL: Acetaminophen (Tylenol), Serum: <10 ug/mL — ABNORMAL LOW (ref 10–30)

## 2020-03-07 LAB — HIV ANTIBODY (ROUTINE TESTING W REFLEX): HIV Screen 4th Generation wRfx: NONREACTIVE

## 2020-03-07 MED ORDER — THIAMINE HCL 100 MG PO TABS
100.0000 mg | ORAL_TABLET | Freq: Every day | ORAL | Status: DC
  Administered 2020-03-07 – 2020-03-10 (×4): 100 mg via ORAL
  Filled 2020-03-07 (×5): qty 1

## 2020-03-07 MED ORDER — ADULT MULTIVITAMIN W/MINERALS CH
1.0000 | ORAL_TABLET | Freq: Every day | ORAL | Status: DC
  Administered 2020-03-07 – 2020-03-10 (×4): 1 via ORAL
  Filled 2020-03-07 (×4): qty 1

## 2020-03-07 MED ORDER — THIAMINE HCL 100 MG/ML IJ SOLN: 100.0000 mg | Freq: Every day | INTRAMUSCULAR | Status: DC

## 2020-03-07 MED ORDER — LORAZEPAM 1 MG PO TABS: 1.0000 mg | ORAL_TABLET | ORAL | Status: DC | PRN

## 2020-03-07 MED ORDER — FOLIC ACID 1 MG PO TABS
1.0000 mg | ORAL_TABLET | Freq: Every day | ORAL | Status: DC
  Administered 2020-03-07 – 2020-03-10 (×4): 1 mg via ORAL
  Filled 2020-03-07 (×4): qty 1

## 2020-03-07 MED ORDER — LORAZEPAM 2 MG/ML IJ SOLN
1.0000 mg | INTRAMUSCULAR | Status: DC | PRN
  Administered 2020-03-07: 1 mg via INTRAVENOUS
  Filled 2020-03-07: qty 1

## 2020-03-07 NOTE — ED Notes (Signed)
Ordered breakfast 

## 2020-03-07 NOTE — Progress Notes (Signed)
[facility] Progress Note  Patient: Brad Peterson    AYT:[id number]  DOA: 03/06/2020     Date of Service: the patient was seen and examined on 03/07/2020  Brief hospital course: No significant past medical history. Currently plan is treat Covid.  Assessment and Plan: 1.  Acute COVID-19 Viral Pneumonia Lab Results  Component Value Date   SARSCOV2NAA POSITIVE (A) 03/06/2020   CXR: hazy bilateral peripheral opacities  Recent Labs    03/06/20 1956  DDIMER 1.34*  FERRITIN >7,500*  LDH 683*  CRP 8.6*    Tmax last 24 hours:  Temp (24hrs), Avg:98.5 F (36.9 C), Min:98.2 F (36.8 C), Max:98.7 F (37.1 C)   Oxygen requirements: 91% on room air  Antibiotics: None Vitamin C and Zinc: Continue DVT Prophylaxis: Subcutaneous Lovenox Remdesivir: Started on admission Steroids: Continue Decadron Baricitinib/Actemra(off-label use): Not a candidate Prone positioning: Patient encouraged to stay in prone position as much as possible.   The treatment plan and use of medications and known side effects were discussed with patient/family. It was clearly explained that there is no proven definitive treatment for COVID-19 infection yet. Complete risks and long-term side effects are unknown, however in the best clinical judgment they seem to be of some clinical benefit rather than medical risks. Patient/family agree with the treatment plan and want to receive these treatments as indicated.  2.  Elevated LFT In the setting of Covid. Hepatitis panel negative.  Tylenol negative.  Likely in the setting of alcoholism although AST ALT ratio not consistent with alcohol induced liver injury.  3.  Alcohol use CIWA protocol Currently not having any withdrawal symptoms  Diet: Regular diet DVT Prophylaxis:   enoxaparin (LOVENOX) injection 40 mg Start: 03/07/20 1000    Advance goals of care discussion: Full code  Family Communication: no family was present at bedside, at the time of  interview.   Disposition:  Status is: Inpatient  Remains inpatient appropriate because:IV treatments appropriate due to intensity of illness or inability to take PO   Dispo: The patient is from: Home              Anticipated d/c is to: Home              Anticipated d/c date is: 2 days              Patient currently is not medically stable to d/c.  Subjective: Continues to have cough and shortness of breath no nausea no vomiting.  No fever no chills.  Physical Exam:  General: Appear in mild distress, no Rash; Oral Mucosa Clear, moist. no Abnormal Neck Mass Or lumps, Conjunctiva normal  Cardiovascular: S1 and S2 Present, no Murmur, Respiratory: good respiratory effort, Bilateral Air entry present and bilateral  Crackles, no wheezes Abdomen: Bowel Sound present, Soft and no tenderness Extremities: no Pedal edema, no calf tenderness Neurology: alert and oriented to time, place, and person affect appropriate. no new focal deficit Gait not checked due to patient safety concerns  Vitals:   03/07/20 1500 03/07/20 1600 03/07/20 1700 03/07/20 1900  BP: 134/84 132/83 (!) 141/89 136/85  Pulse: 75 81 89 90  Resp: (!) 22 (!) 28 (!) 34 (!) 22  Temp:      TempSrc:      SpO2: 92% 91% 94% 92%  Weight:      Height:        Intake/Output Summary (Last 24 hours) at 03/07/2020 2030 Last data filed at 03/07/2020 1732 Gross per 24 hour  Intake 100 ml  Output 1800 ml  Net -1700 ml   Filed Weights   03/06/20 2114  Weight: 97.5 kg    Data Reviewed: I have personally reviewed and interpreted daily labs, tele strips, imagings as discussed above. I reviewed all nursing notes, pharmacy notes, vitals, pertinent old records I have discussed plan of care as described above with RN and patient/family.  CBC: Recent Labs  Lab 03/06/20 1439 03/07/20 0033  WBC 7.2 6.7  HGB 16.7 15.0  HCT 47.3 42.8  MCV 87.4 86.8  PLT 175 169   Basic Metabolic Panel: Recent Labs  Lab 03/06/20 1439  03/07/20 0033  NA 135  --   K 3.8  --   CL 96*  --   CO2 27  --   GLUCOSE 122*  --   BUN 8  --   CREATININE 0.95 0.88  CALCIUM 9.0  --     Studies: No results found.  Scheduled Meds: . enoxaparin (LOVENOX) injection  40 mg Subcutaneous Daily  . folic acid  1 mg Oral Daily  . methylPREDNISolone (SOLU-MEDROL) injection  50 mg Intravenous BID  . multivitamin with minerals  1 tablet Oral Daily  . thiamine  100 mg Oral Daily   Or  . thiamine  100 mg Intravenous Daily   Continuous Infusions: . remdesivir 100 mg in NS 100 mL Stopped (03/07/20 1400)   PRN Meds: LORazepam **OR** LORazepam, ondansetron **OR** ondansetron (ZOFRAN) IV  Time spent: 35 minutes  Author: Lynden Oxford, MD [facility] 03/07/2020 8:30 PM  To reach On-call, see care teams to locate the attending and reach out via [email or web address]. Between 7PM-7AM, please contact night-coverage If you still have difficulty reaching the attending provider, please page the [facility] (Director on Call) for [facility] on amion for assistance.

## 2020-03-07 NOTE — ED Notes (Signed)
Dinner ordered 

## 2020-03-07 NOTE — ED Notes (Signed)
Admitting MD in to see pt.

## 2020-03-07 NOTE — ED Notes (Signed)
Incentive spirometer and flutter valve given to patient with education. Verbalized understanding.  

## 2020-03-08 DIAGNOSIS — J9600 Acute respiratory failure, unspecified whether with hypoxia or hypercapnia: Secondary | ICD-10-CM | POA: Diagnosis not present

## 2020-03-08 DIAGNOSIS — U071 COVID-19: Secondary | ICD-10-CM | POA: Diagnosis not present

## 2020-03-08 LAB — COMPREHENSIVE METABOLIC PANEL
ALT: 175 U/L — ABNORMAL HIGH (ref 0–44)
AST: 98 U/L — ABNORMAL HIGH (ref 15–41)
Albumin: 3 g/dL — ABNORMAL LOW (ref 3.5–5.0)
Alkaline Phosphatase: 84 U/L (ref 38–126)
Anion gap: 12 (ref 5–15)
BUN: 16 mg/dL (ref 6–20)
CO2: 23 mmol/L (ref 22–32)
Calcium: 8.9 mg/dL (ref 8.9–10.3)
Chloride: 101 mmol/L (ref 98–111)
Creatinine, Ser: 0.82 mg/dL (ref 0.61–1.24)
GFR calc Af Amer: >60 mL/min (ref >60)
GFR calc non Af Amer: >60 mL/min (ref >60)
Glucose, Bld: 171 mg/dL — ABNORMAL HIGH (ref 70–99)
Potassium: 4 mmol/L (ref 3.5–5.1)
Sodium: 136 mmol/L (ref 135–145)
Total Bilirubin: 0.7 mg/dL (ref 0.3–1.2)
Total Protein: 6.1 g/dL — ABNORMAL LOW (ref 6.5–8.1)

## 2020-03-08 LAB — BLOOD CULTURE ID PANEL (REFLEXED) - BCID2

## 2020-03-08 LAB — CBC WITH DIFFERENTIAL/PLATELET
Abs Immature Granulocytes: 0.09 10*3/uL — ABNORMAL HIGH (ref 0.00–0.07)
Basophils Absolute: 0 10*3/uL (ref 0.0–0.1)
Basophils Relative: 0 %
Eosinophils Absolute: 0 10*3/uL (ref 0.0–0.5)
Eosinophils Relative: 0 %
HCT: 40.6 % (ref 39.0–52.0)
Hemoglobin: 14.6 g/dL (ref 13.0–17.0)
Immature Granulocytes: 1 %
Lymphocytes Relative: 9 %
Lymphs Abs: 1.2 10*3/uL (ref 0.7–4.0)
MCH: 31.5 pg (ref 26.0–34.0)
MCHC: 36 g/dL (ref 30.0–36.0)
MCV: 87.5 fL (ref 80.0–100.0)
Monocytes Absolute: 0.7 10*3/uL (ref 0.1–1.0)
Monocytes Relative: 6 %
Neutro Abs: 11.1 10*3/uL — ABNORMAL HIGH (ref 1.7–7.7)
Neutrophils Relative %: 84 %
Platelets: 216 10*3/uL (ref 150–400)
RBC: 4.64 MIL/uL (ref 4.22–5.81)
RDW: 11.9 % (ref 11.5–15.5)
WBC: 13.1 10*3/uL — ABNORMAL HIGH (ref 4.0–10.5)
nRBC: 0 % (ref 0.0–0.2)

## 2020-03-08 LAB — MAGNESIUM: Magnesium: 2.4 mg/dL (ref 1.7–2.4)

## 2020-03-08 LAB — C-REACTIVE PROTEIN: CRP: 4.5 mg/dL — ABNORMAL HIGH (ref <1.0)

## 2020-03-08 LAB — D-DIMER, QUANTITATIVE: D-Dimer, Quant: 2.51 ug/mL-FEU — ABNORMAL HIGH (ref 0.00–0.50)

## 2020-03-08 NOTE — ED Notes (Signed)
Breakfast Ordered 

## 2020-03-08 NOTE — Plan of Care (Signed)
  Problem: Education: Goal: Knowledge of General Education information will improve Description: Including pain rating scale, medication(s)/side effects and non-pharmacologic comfort measures Outcome: Progressing   Problem: Clinical Measurements: Goal: Respiratory complications will improve Outcome: Progressing   Problem: Education: Goal: Knowledge of risk factors and measures for prevention of condition will improve Outcome: Progressing   Problem: Respiratory: Goal: Will maintain a patent airway Outcome: Progressing

## 2020-03-08 NOTE — Progress Notes (Signed)
PROGRESS NOTE    Brad Peterson  FYB:[id number] DOB: 07-Oct-1975 DOA: 03/06/2020 PCP: Clementeen Graham, PA-C    Brief Narrative:  44 year old male with no significant past medical history, wife diagnosed with COVID-19 on 8/3, he was diagnosed with COVID-19 on 8/6 presented to the ER with progressively worse shortness of breath mostly with exertion and nonproductive cough and pleuritic chest pain. In the emergency room temperature 101.3, requiring 2 L oxygen, chest x-ray showing bilateral infiltrates.  Markedly elevated LFTs.  Ferritin more than 7500.  Patient was started on COVID-19 directed therapy and admitted to the hospital.   Assessment & Plan:   Principal Problem:   Acute respiratory failure due to COVID-19 [facility]) Active Problems:   Elevated LFTs   Acute respiratory disease due to COVID-19 virus  Acute respiratory failure, hypoxemic respiratory failure due to COVID-19 virus infection: Pneumonia due to COVID-19 virus. Continue to monitor due to significant symptoms  chest physiotherapy, incentive spirometry, deep breathing exercises, sputum induction, mucolytic's and bronchodilators. Supplemental oxygen to keep saturations more than 90%.  Covid directed therapy with , steroids , he is on Solu-Medrol 50 mg twice a day remdesivir day 3/5 antibiotics not indicated Due to severity of symptoms, patient will need daily inflammatory markers, liver function test to monitor and direct COVID-19 therapies.  COVID-19 Labs  Recent Labs    03/06/20 1956 03/08/20 0411  DDIMER 1.34* 2.51*  FERRITIN >7,500*  --   LDH 683*  --   CRP 8.6* 4.5*    Lab Results  Component Value Date   SARSCOV2NAA POSITIVE (A) 03/06/2020   SpO2: 94 % O2 Flow Rate (L/min): 2 L/min  Elevated LFTs: Due to acute viral infection.  Downtrending.  Alcoholism: Counseled to quit.  On CIWA scale and as needed benzodiazepine.  No evidence of alcohol withdrawal.  DVT prophylaxis: enoxaparin (LOVENOX)  injection 40 mg Start: 03/07/20 1000   Code Status: Full code Family Communication: None Disposition Plan: Status is: Inpatient  Remains inpatient appropriate because:IV treatments appropriate due to intensity of illness or inability to take PO   Dispo: The patient is from: Home              Anticipated d/c is to: Home              Anticipated d/c date is: 3 days              Patient currently is not medically stable to d/c.         Consultants:   None  Procedures:   None  Antimicrobials:  Antibiotics Given (last 72 hours)    Date/Time Action Medication Dose Rate   03/06/20 2253 New Bag/Given   remdesivir 200 mg in sodium chloride 0.9% 250 mL IVPB 200 mg 580 mL/hr   03/07/20 1230 New Bag/Given   remdesivir 100 mg in sodium chloride 0.9 % 100 mL IVPB 100 mg 200 mL/hr   03/08/20 1039 New Bag/Given   remdesivir 100 mg in sodium chloride 0.9 % 100 mL IVPB 100 mg 200 mL/hr         Subjective: Patient was seen and examined.  He is still in the emergency room since last 48 hours in a hospital bed.  He feels okay at rest, gets out of breath on minimal mobility.  Not using oxygen at rest, however using oxygen on moving around. He is wondering about discharge planning and home treatment.  Objective: Vitals:   03/08/20 0711 03/08/20 0900 03/08/20 1542 03/08/20 1621  BP:  123/78  124/84  Pulse: 66 83 72 85  Resp: 20 18 (!) 23 20  Temp:  97.7 F (36.5 C)    TempSrc:  Oral    SpO2: 93% 93% 93% 94%  Weight:      Height:        Intake/Output Summary (Last 24 hours) at 03/08/2020 1741 Last data filed at 03/08/2020 1627 Gross per 24 hour  Intake 100 ml  Output 1201 ml  Net -1101 ml   Filed Weights   03/06/20 2114  Weight: 97.5 kg    Examination:  General exam: Appears calm and comfortable  Not in any distress.  On 2 L oxygen. Respiratory system: Clear to auscultation. Respiratory effort normal.  No added sounds. Cardiovascular system: S1 & S2 heard, RRR.    Gastrointestinal system: Soft and nontender.  Bowel sounds present.   Central nervous system: Alert and oriented. No focal neurological deficits. Extremities: Symmetric 5 x 5 power. Skin: No rashes, lesions or ulcers Psychiatry: Judgement and insight appear normal. Mood & affect appropriate.     Data Reviewed: I have personally reviewed following labs and imaging studies  CBC: Recent Labs  Lab 03/06/20 1439 03/07/20 0033 03/08/20 0411  WBC 7.2 6.7 13.1*  NEUTROABS  --   --  11.1*  HGB 16.7 15.0 14.6  HCT 47.3 42.8 40.6  MCV 87.4 86.8 87.5  PLT 175 169 216   Basic Metabolic Panel: Recent Labs  Lab 03/06/20 1439 03/07/20 0033 03/08/20 0411  NA 135  --  136  K 3.8  --  4.0  CL 96*  --  101  CO2 27  --  23  GLUCOSE 122*  --  171*  BUN 8  --  16  CREATININE 0.95 0.88 0.82  CALCIUM 9.0  --  8.9  MG  --   --  2.4   GFR: Estimated Creatinine Clearance: 133.7 mL/min (by C-G formula based on SCr of 0.82 mg/dL). Liver Function Tests: Recent Labs  Lab 03/06/20 1956 03/08/20 0411  AST 257* 98*  ALT 253* 175*  ALKPHOS 92 84  BILITOT 1.2 0.7  PROT 6.7 6.1*  ALBUMIN 3.4* 3.0*   No results for input(s): LIPASE, AMYLASE in the last 168 hours. No results for input(s): AMMONIA in the last 168 hours. Coagulation Profile: No results for input(s): INR, PROTIME in the last 168 hours. Cardiac Enzymes: No results for input(s): CKTOTAL, CKMB, CKMBINDEX, TROPONINI in the last 168 hours. BNP (last 3 results) No results for input(s): PROBNP in the last 8760 hours. HbA1C: No results for input(s): HGBA1C in the last 72 hours. CBG: No results for input(s): GLUCAP in the last 168 hours. Lipid Profile: Recent Labs    03/06/20 1956  TRIG 64   Thyroid Function Tests: No results for input(s): TSH, T4TOTAL, FREET4, T3FREE, THYROIDAB in the last 72 hours. Anemia Panel: Recent Labs    03/06/20 1956  FERRITIN >7,500*   Sepsis Labs: Recent Labs  Lab 03/06/20 1956   PROCALCITON 0.14  LATICACIDVEN 1.6    Recent Results (from the past 240 hour(s))  SARS Coronavirus 2 by RT PCR (hospital order, performed in [facility] hospital lab) Nasopharyngeal Nasopharyngeal Swab     Status: Abnormal   Collection Time: 03/06/20  7:56 PM   Specimen: Nasopharyngeal Swab  Result Value Ref Range Status   SARS Coronavirus 2 POSITIVE (A) NEGATIVE Final    Comment: RESULT CALLED TO, READ BACK BY AND VERIFIED WITH: A COLEMAN RN 2222 03/06/20 A BROWNING (  NOTE) SARS-CoV-2 target nucleic acids are DETECTED  SARS-CoV-2 RNA is generally detectable in upper respiratory specimens  during the acute phase of infection.  Positive results are indicative  of the presence of the identified virus, but do not rule out bacterial infection or co-infection with other pathogens not detected by the test.  Clinical correlation with patient history and  other diagnostic information is necessary to determine patient infection status.  The expected result is negative.  Fact Sheet for Patients:   BoilerBrush.com.cy   Fact Sheet for Healthcare Providers:   https://pope.com/    This test is not yet approved or cleared by the [place] FDA and  has been authorized for detection and/or diagnosis of SARS-CoV-2 by FDA under an Emergency Use Authorization (EUA).  This EUA will remain in effect (meaning this  test can be used) for the duration of  the COVID-19 declaration under Section 564(b)(1) of the Act, 21 U.S.C. section 360-bbb-3(b)(1), unless the authorization is terminated or revoked sooner.  Performed at [facility], [address], [place], [place] [address]   Blood Culture (routine x 2)     Status: None (Preliminary result)   Collection Time: 03/06/20  7:56 PM   Specimen: BLOOD  Result Value Ref Range Status   Specimen Description BLOOD LEFT ARM  Final   Special Requests   Final    BOTTLES DRAWN AEROBIC AND ANAEROBIC  Blood Culture adequate volume   Culture   Final    NO GROWTH 2 DAYS Performed at [facility], [address] [place], [place] [address]    Report Status PENDING  Incomplete  Blood Culture (routine x 2)     Status: None (Preliminary result)   Collection Time: 03/06/20  7:56 PM   Specimen: BLOOD  Result Value Ref Range Status   Specimen Description BLOOD RIGHT ARM  Final   Special Requests   Final    BOTTLES DRAWN AEROBIC AND ANAEROBIC Blood Culture adequate volume   Culture   Final    NO GROWTH 2 DAYS Performed at [facility], [address] [place], [place] [address]    Report Status PENDING  Incomplete         Radiology Studies: DG Chest Portable 1 View  Result Date: 03/06/2020 CLINICAL DATA:  Shortness of breath. EXAM: PORTABLE CHEST 1 VIEW COMPARISON:  None. FINDINGS: Mild atelectasis is seen within the bilateral lung bases. There is no evidence of acute infiltrate, pleural effusion or pneumothorax. The heart size and mediastinal contours are within normal limits. The visualized skeletal structures are unremarkable. IMPRESSION: Mild bibasilar atelectasis. Electronically Signed   By: Aram Candela M.D.   On: 03/06/2020 19:02        Scheduled Meds: . enoxaparin (LOVENOX) injection  40 mg Subcutaneous Daily  . folic acid  1 mg Oral Daily  . methylPREDNISolone (SOLU-MEDROL) injection  50 mg Intravenous BID  . multivitamin with minerals  1 tablet Oral Daily  . thiamine  100 mg Oral Daily   Or  . thiamine  100 mg Intravenous Daily   Continuous Infusions: . remdesivir 100 mg in NS 100 mL Stopped (03/08/20 1226)     LOS: 2 days    Time spent: 30 minutes    Dorcas Carrow, MD [facility] Pager [phone]

## 2020-03-08 NOTE — Progress Notes (Signed)
PHARMACY - PHYSICIAN COMMUNICATION CRITICAL VALUE ALERT - BLOOD CULTURE IDENTIFICATION (BCID)  Brad Peterson is an 44 y.o. male who presented to [facility] on 03/06/2020 with a chief complaint of SOB and noted COVID+  Assessment:  Blood cultures with GPC/clusters in 1/4 bottles with BCID showing staph species. Likely contaminant. WBC= 13.1, afebrile  Name of physician (or Provider) Contacted: Dr. Rachael Darby  Current antibiotics: none  Changes to prescribed antibiotics recommended: No antibiotics recommended Recommendations accepted by provider  Results for orders placed or performed during the hospital encounter of 03/06/20  Blood Culture ID Panel (Reflexed) (Collected: 03/06/2020  7:56 PM)  Result Value Ref Range   Enterococcus faecalis NOT DETECTED NOT DETECTED   Enterococcus Faecium NOT DETECTED NOT DETECTED   Listeria monocytogenes NOT DETECTED NOT DETECTED   Staphylococcus species DETECTED (A) NOT DETECTED   Staphylococcus aureus (BCID) NOT DETECTED NOT DETECTED   Staphylococcus epidermidis NOT DETECTED NOT DETECTED   Staphylococcus lugdunensis NOT DETECTED NOT DETECTED   Streptococcus species NOT DETECTED NOT DETECTED   Streptococcus agalactiae NOT DETECTED NOT DETECTED   Streptococcus pneumoniae NOT DETECTED NOT DETECTED   Streptococcus pyogenes NOT DETECTED NOT DETECTED   A.calcoaceticus-baumannii NOT DETECTED NOT DETECTED   Bacteroides fragilis NOT DETECTED NOT DETECTED   Enterobacterales NOT DETECTED NOT DETECTED   Enterobacter cloacae complex NOT DETECTED NOT DETECTED   Escherichia coli NOT DETECTED NOT DETECTED   Klebsiella aerogenes NOT DETECTED NOT DETECTED   Klebsiella oxytoca NOT DETECTED NOT DETECTED   Klebsiella pneumoniae NOT DETECTED NOT DETECTED   Proteus species NOT DETECTED NOT DETECTED   Salmonella species NOT DETECTED NOT DETECTED   Serratia marcescens NOT DETECTED NOT DETECTED   Haemophilus influenzae NOT DETECTED NOT DETECTED   Neisseria meningitidis  NOT DETECTED NOT DETECTED   Pseudomonas aeruginosa NOT DETECTED NOT DETECTED   Stenotrophomonas maltophilia NOT DETECTED NOT DETECTED   Candida albicans NOT DETECTED NOT DETECTED   Candida auris NOT DETECTED NOT DETECTED   Candida glabrata NOT DETECTED NOT DETECTED   Candida krusei NOT DETECTED NOT DETECTED   Candida parapsilosis NOT DETECTED NOT DETECTED   Candida tropicalis NOT DETECTED NOT DETECTED   Cryptococcus neoformans/gattii NOT DETECTED NOT DETECTED    [name] 03/08/2020  9:03 PM

## 2020-03-08 NOTE — ED Notes (Signed)
Pt transferred to hospital bed for comfort.

## 2020-03-09 DIAGNOSIS — U071 COVID-19: Secondary | ICD-10-CM | POA: Diagnosis not present

## 2020-03-09 DIAGNOSIS — J9600 Acute respiratory failure, unspecified whether with hypoxia or hypercapnia: Secondary | ICD-10-CM | POA: Diagnosis not present

## 2020-03-09 LAB — CBC WITH DIFFERENTIAL/PLATELET
Abs Immature Granulocytes: 0.1 10*3/uL — ABNORMAL HIGH (ref 0.00–0.07)
Basophils Absolute: 0 10*3/uL (ref 0.0–0.1)
Basophils Relative: 0 %
Eosinophils Absolute: 0 10*3/uL (ref 0.0–0.5)
Eosinophils Relative: 0 %
HCT: 40.7 % (ref 39.0–52.0)
Hemoglobin: 14.2 g/dL (ref 13.0–17.0)
Immature Granulocytes: 1 %
Lymphocytes Relative: 7 %
Lymphs Abs: 1.1 10*3/uL (ref 0.7–4.0)
MCH: 30.3 pg (ref 26.0–34.0)
MCHC: 34.9 g/dL (ref 30.0–36.0)
MCV: 86.8 fL (ref 80.0–100.0)
Monocytes Absolute: 0.9 10*3/uL (ref 0.1–1.0)
Monocytes Relative: 6 %
Neutro Abs: 12.9 10*3/uL — ABNORMAL HIGH (ref 1.7–7.7)
Neutrophils Relative %: 86 %
Platelets: 224 10*3/uL (ref 150–400)
RBC: 4.69 MIL/uL (ref 4.22–5.81)
RDW: 11.9 % (ref 11.5–15.5)
WBC: 14.9 10*3/uL — ABNORMAL HIGH (ref 4.0–10.5)
nRBC: 0 % (ref 0.0–0.2)

## 2020-03-09 LAB — COMPREHENSIVE METABOLIC PANEL
ALT: 158 U/L — ABNORMAL HIGH (ref 0–44)
AST: 78 U/L — ABNORMAL HIGH (ref 15–41)
Albumin: 3 g/dL — ABNORMAL LOW (ref 3.5–5.0)
Alkaline Phosphatase: 75 U/L (ref 38–126)
Anion gap: 9 (ref 5–15)
BUN: 13 mg/dL (ref 6–20)
CO2: 24 mmol/L (ref 22–32)
Calcium: 8.9 mg/dL (ref 8.9–10.3)
Chloride: 104 mmol/L (ref 98–111)
Creatinine, Ser: 0.91 mg/dL (ref 0.61–1.24)
GFR calc Af Amer: >60 mL/min (ref >60)
GFR calc non Af Amer: >60 mL/min (ref >60)
Glucose, Bld: 153 mg/dL — ABNORMAL HIGH (ref 70–99)
Potassium: 4.7 mmol/L (ref 3.5–5.1)
Sodium: 137 mmol/L (ref 135–145)
Total Bilirubin: 0.9 mg/dL (ref 0.3–1.2)
Total Protein: 5.9 g/dL — ABNORMAL LOW (ref 6.5–8.1)

## 2020-03-09 LAB — C-REACTIVE PROTEIN: CRP: 1.5 mg/dL — ABNORMAL HIGH (ref <1.0)

## 2020-03-09 LAB — CULTURE, BLOOD (ROUTINE X 2): Special Requests: ADEQUATE

## 2020-03-09 LAB — D-DIMER, QUANTITATIVE: D-Dimer, Quant: 0.64 ug/mL-FEU — ABNORMAL HIGH (ref 0.00–0.50)

## 2020-03-09 LAB — FERRITIN: Ferritin: 3844 ng/mL — ABNORMAL HIGH (ref 24–336)

## 2020-03-09 NOTE — Progress Notes (Signed)
Pt has rested throughout the day. Denies any pain and/or discomfort. No S/S of ETOH withdrawal with CIWA consistently scoring zero. Scheduled meds taken without difficulty.  Will continue to monitor.

## 2020-03-09 NOTE — Plan of Care (Signed)
°  Problem: Education: Goal: Knowledge of General Education information will improve Description: Including pain rating scale, medication(s)/side effects and non-pharmacologic comfort measures Outcome: Progressing   Problem: Clinical Measurements: Goal: Respiratory complications will improve Outcome: Progressing   Problem: Education: Goal: Knowledge of risk factors and measures for prevention of condition will improve Outcome: Progressing   Problem: Respiratory: Goal: Will maintain a patent airway Outcome: Progressing   

## 2020-03-09 NOTE — TOC Initial Note (Signed)
Transition of Care [facility]) - Initial/Assessment Note    Patient Details  Name: Brad Peterson MRN: [id number] Date of Birth: 1976/07/04  [facility] CM/SW Contact:    Reola Mosher Transition of Care Supervisor Phone Number: [phone] 03/09/2020, 12:31 PM  Clinical Narrative:                 Chart reviewed, patient lives with spouse; PCP: Scifres, Peter Minium; has private insurance with BCBS with prescription drug coverage. No needs identified at this time; CM will continue to follow for progression of care.  Expected Discharge Plan: Home/Self Care Barriers to Discharge: No Barriers Identified   Patient Goals and CMS Choice        Expected Discharge Plan and Services Expected Discharge Plan: Home/Self Care                                              Prior Living Arrangements/Services                       Activities of Daily Living Home Assistive Devices/Equipment: None ADL Screening (condition at time of admission) Patient's cognitive ability adequate to safely complete daily activities?: Yes Is the patient deaf or have difficulty hearing?: No Does the patient have difficulty seeing, even when wearing glasses/contacts?: No Does the patient have difficulty concentrating, remembering, or making decisions?: No Patient able to express need for assistance with ADLs?: Yes Does the patient have difficulty dressing or bathing?: No Independently performs ADLs?: Yes (appropriate for developmental age) Does the patient have difficulty walking or climbing stairs?: No Weakness of Legs: None Weakness of Arms/Hands: None  Permission Sought/Granted                  Emotional Assessment              Admission diagnosis:  Acute respiratory failure with hypoxia (HCC) [J96.01] Acute respiratory failure due to COVID-19 (HCC) [U07.1, J96.00] Acute respiratory disease due to COVID-19 virus [U07.1, J06.9] COVID-19  [U07.1] Patient Active Problem List   Diagnosis Date Noted  . Acute respiratory failure due to COVID-19 (HCC) 03/06/2020  . Elevated LFTs 03/06/2020  . Acute respiratory disease due to COVID-19 virus 03/06/2020   PCP:  Clementeen Graham, PA-C Pharmacy:   [facility] [phone] - [place], [place] - [address] [facility] [place] [address] Phone: [phone] Fax: [phone]     Social Determinants of Health (SDOH) Interventions    Readmission Risk Interventions No flowsheet data found.

## 2020-03-09 NOTE — Progress Notes (Signed)
PROGRESS NOTE    Brad Peterson  ZDG:[id number] DOB: [birth year] DOA: 03/06/2020 PCP: Clementeen Graham, PA-C    Brief Narrative:  44 year old male with no significant past medical history, wife diagnosed with COVID-19 on 8/3, he was diagnosed with COVID-19 on 8/6 presented to the ER with progressively worse shortness of breath mostly with exertion and nonproductive cough and pleuritic chest pain. In the emergency room temperature 101.3, requiring 2 L oxygen, chest x-ray showing bilateral infiltrates.  Markedly elevated LFTs.  Ferritin more than 7500.  Patient was started on COVID-19 directed therapy and admitted to the hospital.   Assessment & Plan:   Principal Problem:   Acute respiratory failure due to COVID-19 [facility]) Active Problems:   Elevated LFTs   Acute respiratory disease due to COVID-19 virus  Acute respiratory failure, hypoxemic respiratory failure due to COVID-19 virus infection: Pneumonia due to COVID-19 virus. Continue to monitor due to significant symptoms  chest physiotherapy, incentive spirometry, deep breathing exercises, sputum induction, mucolytic's and bronchodilators. Supplemental oxygen to keep saturations more than 90%.  Covid directed therapy with , steroids , he is on Solu-Medrol 50 mg twice a day remdesivir day 4/5 antibiotics not indicated Due to severity of symptoms, patient will need daily inflammatory markers, liver function test to monitor and direct COVID-19 therapies.  COVID-19 Labs  Recent Labs    03/06/20 1956 03/08/20 0411 03/09/20 0500  DDIMER 1.34* 2.51* 0.64*  FERRITIN >7,500*  --  3,844*  LDH 683*  --   --   CRP 8.6* 4.5* 1.5*    Lab Results  Component Value Date   SARSCOV2NAA POSITIVE (A) 03/06/2020   SpO2: 93 % O2 Flow Rate (L/min): 2 L/min  Elevated LFTs: Due to acute viral infection.  Downtrending.  Alcoholism: Counseled to quit.  On CIWA scale and as needed benzodiazepine.  No evidence of alcohol withdrawal.  DVT  prophylaxis: enoxaparin (LOVENOX) injection 40 mg Start: 03/07/20 1000   Code Status: Full code Family Communication: None Disposition Plan: Status is: Inpatient  Remains inpatient appropriate because:IV treatments appropriate due to intensity of illness or inability to take PO   Dispo: The patient is from: Home              Anticipated d/c is to: Home              Anticipated d/c date is: tomorrow if stable.              Patient currently is not medically stable to d/c.         Consultants:   None  Procedures:   None  Antimicrobials:  Antibiotics Given (last 72 hours)    Date/Time Action Medication Dose Rate   03/06/20 2253 New Bag/Given   remdesivir 200 mg in sodium chloride 0.9% 250 mL IVPB 200 mg 580 mL/hr   03/07/20 1230 New Bag/Given   remdesivir 100 mg in sodium chloride 0.9 % 100 mL IVPB 100 mg 200 mL/hr   03/08/20 1039 New Bag/Given   remdesivir 100 mg in sodium chloride 0.9 % 100 mL IVPB 100 mg 200 mL/hr   03/09/20 0942 New Bag/Given   remdesivir 100 mg in sodium chloride 0.9 % 100 mL IVPB 100 mg 200 mL/hr         Subjective: No overnight events. Still short of breath on mobility.   Objective: Vitals:   03/09/20 0425 03/09/20 0800 03/09/20 1100 03/09/20 1333  BP: 116/80   132/89  Pulse: 64 66 80 87  Resp: 18  Temp: 98.7 F (37.1 C)   97.7 F (36.5 C)  TempSrc: Oral   Oral  SpO2: 90% 91%  93%  Weight:      Height:        Intake/Output Summary (Last 24 hours) at 03/09/2020 1423 Last data filed at 03/09/2020 1300 Gross per 24 hour  Intake 600 ml  Output 551 ml  Net 49 ml   Filed Weights   03/06/20 2114  Weight: 97.5 kg    Examination:  General exam: Appears calm and comfortable , sitting in chair Not in any distress.  On 2 L oxygen. Respiratory system: Clear to auscultation. Respiratory effort normal.  No added sounds. Cardiovascular system: S1 & S2 heard, RRR.  Gastrointestinal system: Soft and nontender.  Bowel sounds present.    Central nervous system: Alert and oriented. No focal neurological deficits. Extremities: Symmetric 5 x 5 power. Skin: No rashes, lesions or ulcers Psychiatry: Judgement and insight appear normal. Mood & affect appropriate.     Data Reviewed: I have personally reviewed following labs and imaging studies  CBC: Recent Labs  Lab 03/06/20 1439 03/07/20 0033 03/08/20 0411 03/09/20 0500  WBC 7.2 6.7 13.1* 14.9*  NEUTROABS  --   --  11.1* 12.9*  HGB 16.7 15.0 14.6 14.2  HCT 47.3 42.8 40.6 40.7  MCV 87.4 86.8 87.5 86.8  PLT 175 169 216 224   Basic Metabolic Panel: Recent Labs  Lab 03/06/20 1439 03/07/20 0033 03/08/20 0411 03/09/20 0500  NA 135  --  136 137  K 3.8  --  4.0 4.7  CL 96*  --  101 104  CO2 27  --  23 24  GLUCOSE 122*  --  171* 153*  BUN 8  --  16 13  CREATININE 0.95 0.88 0.82 0.91  CALCIUM 9.0  --  8.9 8.9  MG  --   --  2.4  --    GFR: Estimated Creatinine Clearance: 120.4 mL/min (by C-G formula based on SCr of 0.91 mg/dL). Liver Function Tests: Recent Labs  Lab 03/06/20 1956 03/08/20 0411 03/09/20 0500  AST 257* 98* 78*  ALT 253* 175* 158*  ALKPHOS 92 84 75  BILITOT 1.2 0.7 0.9  PROT 6.7 6.1* 5.9*  ALBUMIN 3.4* 3.0* 3.0*   No results for input(s): LIPASE, AMYLASE in the last 168 hours. No results for input(s): AMMONIA in the last 168 hours. Coagulation Profile: No results for input(s): INR, PROTIME in the last 168 hours. Cardiac Enzymes: No results for input(s): CKTOTAL, CKMB, CKMBINDEX, TROPONINI in the last 168 hours. BNP (last 3 results) No results for input(s): PROBNP in the last 8760 hours. HbA1C: No results for input(s): HGBA1C in the last 72 hours. CBG: No results for input(s): GLUCAP in the last 168 hours. Lipid Profile: Recent Labs    03/06/20 1956  TRIG 64   Thyroid Function Tests: No results for input(s): TSH, T4TOTAL, FREET4, T3FREE, THYROIDAB in the last 72 hours. Anemia Panel: Recent Labs    03/06/20 1956  03/09/20 0500  FERRITIN >7,500* 3,844*   Sepsis Labs: Recent Labs  Lab 03/06/20 1956  PROCALCITON 0.14  LATICACIDVEN 1.6    Recent Results (from the past 240 hour(s))  SARS Coronavirus 2 by RT PCR (hospital order, [facility]) Nasopharyngeal Nasopharyngeal Swab     Status: Abnormal   Collection Time: 03/06/20  7:56 PM   Specimen: Nasopharyngeal Swab  Result Value Ref Range Status   SARS Coronavirus 2 POSITIVE (A) NEGATIVE Final  Comment: RESULT CALLED TO, READ BACK BY AND VERIFIED WITH: A COLEMAN RN 2222 03/06/20 A BROWNING (NOTE) SARS-CoV-2 target nucleic acids are DETECTED  SARS-CoV-2 RNA is generally detectable in upper respiratory specimens  during the acute phase of infection.  Positive results are indicative  of the presence of the identified virus, but do not rule out bacterial infection or co-infection with other pathogens not detected by the test.  Clinical correlation with patient history and  other diagnostic information is necessary to determine patient infection status.  The expected result is negative.  Fact Sheet for Patients:   BoilerBrush.com.cy   Fact Sheet for Healthcare Providers:   https://pope.com/    This test is not yet approved or cleared by the [place] FDA and  has been authorized for detection and/or diagnosis of SARS-CoV-2 by FDA under an Emergency Use Authorization (EUA).  This EUA will remain in effect (meaning this  test can be used) for the duration of  the COVID-19 declaration under Section 564(b)(1) of the Act, 21 U.S.C. section 360-bbb-3(b)(1), unless the authorization is terminated or revoked sooner.  Performed at [facility], [address] [place], [place] [address]   Blood Culture (routine x 2)     Status: Abnormal   Collection Time: 03/06/20  7:56 PM   Specimen: BLOOD  Result Value Ref Range Status   Specimen Description BLOOD LEFT ARM   Final   Special Requests   Final    BOTTLES DRAWN AEROBIC AND ANAEROBIC Blood Culture adequate volume   Culture  Setup Time   Final    GRAM POSITIVE COCCI IN CLUSTERS AEROBIC BOTTLE ONLY Organism ID to follow CRITICAL RESULT CALLED TO, READ BACK BY AND VERIFIED WITH: ANDREW MEYER PHARMD @2053  03/08/20 EB    Culture (A)  Final    STAPHYLOCOCCUS CAPITIS THE SIGNIFICANCE OF ISOLATING THIS ORGANISM FROM A SINGLE SET OF BLOOD CULTURES WHEN MULTIPLE SETS ARE DRAWN IS UNCERTAIN. PLEASE NOTIFY THE MICROBIOLOGY DEPARTMENT WITHIN ONE WEEK IF SPECIATION AND SENSITIVITIES ARE REQUIRED. Performed at [facility], [address] [place], [place]    Report Status 03/09/2020 FINAL  Final  Blood Culture (routine x 2)     Status: None (Preliminary result)   Collection Time: 03/06/20  7:56 PM   Specimen: BLOOD  Result Value Ref Range Status   Specimen Description BLOOD RIGHT ARM  Final   Special Requests   Final    BOTTLES DRAWN AEROBIC AND ANAEROBIC Blood Culture adequate volume   Culture   Final    NO GROWTH 3 DAYS Performed at [facility], [address], [place], [place]    Report Status PENDING  Incomplete  Blood Culture ID Panel (Reflexed)     Status: Abnormal   Collection Time: 03/06/20  7:56 PM  Result Value Ref Range Status   Enterococcus faecalis NOT DETECTED NOT DETECTED Final   Enterococcus Faecium NOT DETECTED NOT DETECTED Final   Listeria monocytogenes NOT DETECTED NOT DETECTED Final   Staphylococcus species DETECTED (A) NOT DETECTED Final    Comment: RESULT CALLED TO, READ BACK BY AND VERIFIED WITH: 03/08/20 PHARMD @2053  03/08/20 EB    Staphylococcus aureus (BCID) NOT DETECTED NOT DETECTED Final   Staphylococcus epidermidis NOT DETECTED NOT DETECTED Final   Staphylococcus lugdunensis NOT DETECTED NOT DETECTED Final   Streptococcus species NOT DETECTED NOT DETECTED Final   Streptococcus agalactiae NOT DETECTED NOT DETECTED Final   Streptococcus  pneumoniae NOT DETECTED NOT  DETECTED Final   Streptococcus pyogenes NOT DETECTED NOT DETECTED Final   A.calcoaceticus-baumannii NOT DETECTED NOT DETECTED Final   Bacteroides fragilis NOT DETECTED NOT DETECTED Final   Enterobacterales NOT DETECTED NOT DETECTED Final   Enterobacter cloacae complex NOT DETECTED NOT DETECTED Final   Escherichia coli NOT DETECTED NOT DETECTED Final   Klebsiella aerogenes NOT DETECTED NOT DETECTED Final   Klebsiella oxytoca NOT DETECTED NOT DETECTED Final   Klebsiella pneumoniae NOT DETECTED NOT DETECTED Final   Proteus species NOT DETECTED NOT DETECTED Final   Salmonella species NOT DETECTED NOT DETECTED Final   Serratia marcescens NOT DETECTED NOT DETECTED Final   Haemophilus influenzae NOT DETECTED NOT DETECTED Final   Neisseria meningitidis NOT DETECTED NOT DETECTED Final   Pseudomonas aeruginosa NOT DETECTED NOT DETECTED Final   Stenotrophomonas maltophilia NOT DETECTED NOT DETECTED Final   Candida albicans NOT DETECTED NOT DETECTED Final   Candida auris NOT DETECTED NOT DETECTED Final   Candida glabrata NOT DETECTED NOT DETECTED Final   Candida krusei NOT DETECTED NOT DETECTED Final   Candida parapsilosis NOT DETECTED NOT DETECTED Final   Candida tropicalis NOT DETECTED NOT DETECTED Final   Cryptococcus neoformans/gattii NOT DETECTED NOT DETECTED Final    Comment: Performed at [facility], [address] [place], [place] [address]         Radiology Studies: No results found.      Scheduled Meds: . enoxaparin (LOVENOX) injection  40 mg Subcutaneous Daily  . folic acid  1 mg Oral Daily  . methylPREDNISolone (SOLU-MEDROL) injection  50 mg Intravenous BID  . multivitamin with minerals  1 tablet Oral Daily  . thiamine  100 mg Oral Daily   Or  . thiamine  100 mg Intravenous Daily   Continuous Infusions: . remdesivir 100 mg in NS 100 mL 100 mg (03/09/20 0942)     LOS: 3 days    Time spent: 30 minutes    Dorcas Carrow,  MD [facility] Pager [phone]

## 2020-03-10 DIAGNOSIS — U071 COVID-19: Secondary | ICD-10-CM | POA: Diagnosis not present

## 2020-03-10 DIAGNOSIS — J069 Acute upper respiratory infection, unspecified: Secondary | ICD-10-CM | POA: Diagnosis not present

## 2020-03-10 DIAGNOSIS — J9600 Acute respiratory failure, unspecified whether with hypoxia or hypercapnia: Secondary | ICD-10-CM | POA: Diagnosis not present

## 2020-03-10 LAB — COMPREHENSIVE METABOLIC PANEL
ALT: 180 U/L — ABNORMAL HIGH (ref 0–44)
AST: 81 U/L — ABNORMAL HIGH (ref 15–41)
Albumin: 3.2 g/dL — ABNORMAL LOW (ref 3.5–5.0)
Alkaline Phosphatase: 81 U/L (ref 38–126)
Anion gap: 12 (ref 5–15)
BUN: 17 mg/dL (ref 6–20)
CO2: 22 mmol/L (ref 22–32)
Calcium: 8.9 mg/dL (ref 8.9–10.3)
Chloride: 104 mmol/L (ref 98–111)
Creatinine, Ser: 0.92 mg/dL (ref 0.61–1.24)
GFR calc Af Amer: >60 mL/min (ref >60)
GFR calc non Af Amer: >60 mL/min (ref >60)
Glucose, Bld: 192 mg/dL — ABNORMAL HIGH (ref 70–99)
Potassium: 4 mmol/L (ref 3.5–5.1)
Sodium: 138 mmol/L (ref 135–145)
Total Bilirubin: 1 mg/dL (ref 0.3–1.2)
Total Protein: 6.1 g/dL — ABNORMAL LOW (ref 6.5–8.1)

## 2020-03-10 LAB — CBC WITH DIFFERENTIAL/PLATELET
Abs Immature Granulocytes: 0.17 10*3/uL — ABNORMAL HIGH (ref 0.00–0.07)
Basophils Absolute: 0 10*3/uL (ref 0.0–0.1)
Basophils Relative: 0 %
Eosinophils Absolute: 0 10*3/uL (ref 0.0–0.5)
Eosinophils Relative: 0 %
HCT: 43.8 % (ref 39.0–52.0)
Hemoglobin: 15.2 g/dL (ref 13.0–17.0)
Immature Granulocytes: 1 %
Lymphocytes Relative: 9 %
Lymphs Abs: 1.2 10*3/uL (ref 0.7–4.0)
MCH: 30.1 pg (ref 26.0–34.0)
MCHC: 34.7 g/dL (ref 30.0–36.0)
MCV: 86.7 fL (ref 80.0–100.0)
Monocytes Absolute: 1 10*3/uL (ref 0.1–1.0)
Monocytes Relative: 7 %
Neutro Abs: 11.7 10*3/uL — ABNORMAL HIGH (ref 1.7–7.7)
Neutrophils Relative %: 83 %
Platelets: 287 10*3/uL (ref 150–400)
RBC: 5.05 MIL/uL (ref 4.22–5.81)
RDW: 11.9 % (ref 11.5–15.5)
WBC: 14.1 10*3/uL — ABNORMAL HIGH (ref 4.0–10.5)
nRBC: 0 % (ref 0.0–0.2)

## 2020-03-10 LAB — FERRITIN: Ferritin: 3074 ng/mL — ABNORMAL HIGH (ref 24–336)

## 2020-03-10 LAB — D-DIMER, QUANTITATIVE: D-Dimer, Quant: 0.62 ug/mL-FEU — ABNORMAL HIGH (ref 0.00–0.50)

## 2020-03-10 LAB — C-REACTIVE PROTEIN: CRP: 0.9 mg/dL (ref <1.0)

## 2020-03-10 MED ORDER — ADULT MULTIVITAMIN W/MINERALS CH: 1.0000 | ORAL_TABLET | Freq: Every day | ORAL | 0 refills | Status: AC

## 2020-03-10 MED ORDER — THIAMINE HCL 100 MG PO TABS: 100.0000 mg | ORAL_TABLET | Freq: Every day | ORAL | 0 refills | Status: AC

## 2020-03-10 MED ORDER — DEXAMETHASONE 6 MG PO TABS: 6.0000 mg | ORAL_TABLET | Freq: Every day | ORAL | 0 refills | Status: AC

## 2020-03-10 NOTE — TOC Transition Note (Signed)
Transition of Care [facility]) - CM/SW Discharge Note   Patient Details  Name: Brad Peterson MRN: [id number] Date of Birth: 09-Sep-1975  [facility]) CM/SW Contact:  Lockie Pares, RN Phone Number: 03/10/2020, 5:18 PM   Clinical Narrative:    Patient needing o2 for home use. Ready to discharge as long as oxygen delivered. No further needs identified.    Final next level of care: Home/Self Care Barriers to Discharge: No Barriers Identified   Patient Goals and CMS Choice Patient states their goals for this hospitalization and ongoing recovery are:: Home to recover      Discharge Placement                       Discharge Plan and Services                DME Arranged: Oxygen DME Agency: [employer] Date DME Agency Contacted: 03/10/20 Time DME Agency Contacted: [phone] Representative spoke with at DME Agency: Oletha Cruel            Social Determinants of Health (SDOH) Interventions     Readmission Risk Interventions No flowsheet data found.

## 2020-03-10 NOTE — Progress Notes (Signed)
Patient discharge home with wife via wheelchair. Reviewed all dc instructions and given prescription. All questions answered. PIV removed. Oxygen delivered to patient room. All belongings with patient.

## 2020-03-10 NOTE — Progress Notes (Signed)
SATURATION QUALIFICATIONS: (This note is used to comply with regulatory documentation for home oxygen)  Patient Saturations on Room Air at Rest = 92%  Patient Saturations on Room Air while Ambulating = 86%  Patient Saturations on 2 Liters of oxygen while Ambulating = 89%  Please briefly explain why patient needs home oxygen: While ambulating patient desat to 86% on Room air and was mildly short of breath. Pt recovered well on 2L.

## 2020-03-10 NOTE — Discharge Summary (Signed)
Physician Discharge Summary  Yvonne KendallRandy M Vastine ZOX:[id number] DOB: [birth year] DOA: 03/06/2020  PCP: Clementeen GrahamScifres, Dorothy, PA-C  Admit date: 03/06/2020 Discharge date: 03/10/2020  Admitted From: Home Disposition: Home  Recommendations for Outpatient Follow-up:  1. Follow up with PCP in 1-2 weeks   Home Health: Not applicable Equipment/Devices: Oxygen  Discharge Condition: Stable CODE STATUS: Full code Diet recommendation: Regular diet  Discharge summary: 44 year old gentleman with no significant medical history, wife diagnosed with COVID-19 on 8/3, he was diagnosed with COVID-19 on 8/6 presented to the ER on 8/16 with progressively worsening shortness of breath, nonproductive cough and pleuritic chest pain.  At the emergency room, temperature 101.3, hypoxic requiring 2 L oxygen, chest x-ray showing bilateral infiltrates.  Elevated LFTs.  Ferritin more than 7500.  He was started on Covid directed therapy and admitted to the hospital.  Patient was admitted to the hospital and treated for pneumonia related to COVID-19 virus infection with acute hypoxemic respiratory failure with aggressive chest physiotherapy and bronchodilators,IV steroids, remdesivir day 5/5 today.  He did good clinical recovery.  His symptoms has mostly improved, LFTs trending down. With improvement of symptoms he is able to go home today.  He is a still requiring 2 L of supplemental oxygen so it will be prescribed for short-term.  Counseling and education done regarding COVID-19 vaccination and patient is willing to consider it once he improves from this episode.  Counseled on cutting down or quitting alcohol and patient is agreeable.  No evidence of alcohol withdrawal in the hospital.  Discharge Diagnoses:  Principal Problem:   Acute respiratory failure due to COVID-19 [facility](HCC) Active Problems:   Elevated LFTs   Acute respiratory disease due to COVID-19 virus    Discharge Instructions  Discharge Instructions    Call  MD for:  difficulty breathing, headache or visual disturbances   Complete by: As directed    Call MD for:  temperature >100.4   Complete by: As directed    Diet general   Complete by: As directed    Discharge instructions   Complete by: As directed    Can take over-the-counter cough medications like Mucinex and Tylenol. Isolation precautions for total 3 weeks since initial test results. Consider taking COVID-19 vaccination once you have improvement of symptoms.   Increase activity slowly   Complete by: As directed      Allergies as of 03/10/2020      Reactions   Amoxicillin Rash      Medication List    STOP taking these medications   GUAIFENESIN PO     TAKE these medications   dexamethasone 6 MG tablet Commonly known as: Decadron Take 1 tablet (6 mg total) by mouth daily for 6 days.   multivitamin with minerals Tabs tablet Take 1 tablet by mouth daily. Start taking on: March 11, 2020   thiamine 100 MG tablet Take 1 tablet (100 mg total) by mouth daily. Start taking on: March 11, 2020            Durable Medical Equipment  (From admission, onward)         Start     Ordered   03/10/20 1227  DME Oxygen  Once       Comments: Patient Saturations on Room Air at Rest = 92%  Patient Saturations on [employer] Air while Ambulating = 86%  Patient Saturations on 2 Liters of oxygen while Ambulating = 89%  Question Answer Comment  Length of Need 6 Months   Mode or (Route) Nasal  cannula   Liters per Minute 2   Frequency Continuous (stationary and portable oxygen unit needed)   Oxygen conserving device Yes   Oxygen delivery system Gas      03/10/20 1228          Follow-up Information    Scifres, Nicole Cella, PA-C Follow up in 2 week(s).   Specialty: Physician Assistant Contact information: [address] [place] [address] [phone]              Allergies  Allergen Reactions  . Amoxicillin Rash     Consultations:  None   Procedures/Studies: DG Chest Portable 1 View  Result Date: 03/06/2020 CLINICAL DATA:  Shortness of breath. EXAM: PORTABLE CHEST 1 VIEW COMPARISON:  None. FINDINGS: Mild atelectasis is seen within the bilateral lung bases. There is no evidence of acute infiltrate, pleural effusion or pneumothorax. The heart size and mediastinal contours are within normal limits. The visualized skeletal structures are unremarkable. IMPRESSION: Mild bibasilar atelectasis. Electronically Signed   By: Aram Candela M.D.   On: 03/06/2020 19:02    (Echo, Carotid, EGD, Colonoscopy, ERCP)    Subjective: Patient seen and examined.  Was walking in the room.  Afebrile.  Still feels somehow shortness of breath on exertion but feels okay at rest.  Some dry cough. Eager to go home.   Discharge Exam: Vitals:   03/09/20 2036 03/10/20 0522  BP: (!) 127/91 119/84  Pulse: 65 72  Resp: 18 18  Temp: 98.8 F (37.1 C) 98.6 F (37 C)  SpO2: 95% 90%   Vitals:   03/09/20 1333 03/09/20 1520 03/09/20 2036 03/10/20 0522  BP: 132/89  (!) 127/91 119/84  Pulse: 87 60 65 72  Resp:   18 18  Temp: 97.7 F (36.5 C)  98.8 F (37.1 C) 98.6 F (37 C)  TempSrc: Oral  Oral Oral  SpO2: 93%  95% 90%  Weight:      Height:        General: Pt is alert, awake, not in acute distress On 2 L oxygen. Cardiovascular: RRR, S1/S2 +, no rubs, no gallops Respiratory: CTA bilaterally, no wheezing, no rhonchi, no added sounds. Abdominal: Soft, NT, ND, bowel sounds + Extremities: no edema, no cyanosis    The results of significant diagnostics from this hospitalization (including imaging, microbiology, ancillary and laboratory) are listed below for reference.     Microbiology: Recent Results (from the past 240 hour(s))  SARS Coronavirus 2 by RT PCR (hospital order, performed in [facility] hospital lab) Nasopharyngeal Nasopharyngeal Swab     Status: Abnormal   Collection Time: 03/06/20  7:56 PM    Specimen: Nasopharyngeal Swab  Result Value Ref Range Status   SARS Coronavirus 2 POSITIVE (A) NEGATIVE Final    Comment: RESULT CALLED TO, READ BACK BY AND VERIFIED WITH: A COLEMAN RN 2222 03/06/20 A BROWNING (NOTE) SARS-CoV-2 target nucleic acids are DETECTED  SARS-CoV-2 RNA is generally detectable in upper respiratory specimens  during the acute phase of infection.  Positive results are indicative  of the presence of the identified virus, but do not rule out bacterial infection or co-infection with other pathogens not detected by the test.  Clinical correlation with patient history and  other diagnostic information is necessary to determine patient infection status.  The expected result is negative.  Fact Sheet for Patients:   BoilerBrush.com.cy   Fact Sheet for Healthcare Providers:   https://pope.com/    This test is not yet approved or cleared by  the [employer] and  has been authorized for detection and/or diagnosis of SARS-CoV-2 by FDA under an Emergency Use Authorization (EUA).  This EUA will remain in effect (meaning this  test can be used) for the duration of  the COVID-19 declaration under Section 564(b)(1) of the Act, 21 U.S.C. section 360-bbb-3(b)(1), unless the authorization is terminated or revoked sooner.  Performed at [facility], [address], [place], [place] [address]   Blood Culture (routine x 2)     Status: Abnormal   Collection Time: 03/06/20  7:56 PM   Specimen: BLOOD  Result Value Ref Range Status   Specimen Description BLOOD LEFT ARM  Final   Special Requests   Final    BOTTLES DRAWN AEROBIC AND ANAEROBIC Blood Culture adequate volume   Culture  Setup Time   Final    GRAM POSITIVE COCCI IN CLUSTERS AEROBIC BOTTLE ONLY Organism ID to follow CRITICAL RESULT CALLED TO, READ BACK BY AND VERIFIED WITH: ANDREW MEYER PHARMD @2053  03/08/20 EB    Culture (A)  Final    STAPHYLOCOCCUS  CAPITIS THE SIGNIFICANCE OF ISOLATING THIS ORGANISM FROM A SINGLE SET OF BLOOD CULTURES WHEN MULTIPLE SETS ARE DRAWN IS UNCERTAIN. PLEASE NOTIFY THE MICROBIOLOGY DEPARTMENT WITHIN ONE WEEK IF SPECIATION AND SENSITIVITIES ARE REQUIRED. Performed at [facility], [address], [place], [place]    Report Status 03/09/2020 FINAL  Final  Blood Culture (routine x 2)     Status: None (Preliminary result)   Collection Time: 03/06/20  7:56 PM   Specimen: BLOOD  Result Value Ref Range Status   Specimen Description BLOOD RIGHT ARM  Final   Special Requests   Final    BOTTLES DRAWN AEROBIC AND ANAEROBIC Blood Culture adequate volume   Culture   Final    NO GROWTH 4 DAYS Performed at [facility], [address] [place], [place]    Report Status PENDING  Incomplete  Blood Culture ID Panel (Reflexed)     Status: Abnormal   Collection Time: 03/06/20  7:56 PM  Result Value Ref Range Status   Enterococcus faecalis NOT DETECTED NOT DETECTED Final   Enterococcus Faecium NOT DETECTED NOT DETECTED Final   Listeria monocytogenes NOT DETECTED NOT DETECTED Final   Staphylococcus species DETECTED (A) NOT DETECTED Final    Comment: RESULT CALLED TO, READ BACK BY AND VERIFIED WITH: ANDREW MEYER PHARMD @2053  03/08/20 EB    Staphylococcus aureus (BCID) NOT DETECTED NOT DETECTED Final   Staphylococcus epidermidis NOT DETECTED NOT DETECTED Final   Staphylococcus lugdunensis NOT DETECTED NOT DETECTED Final   Streptococcus species NOT DETECTED NOT DETECTED Final   Streptococcus agalactiae NOT DETECTED NOT DETECTED Final   Streptococcus pneumoniae NOT DETECTED NOT DETECTED Final   Streptococcus pyogenes NOT DETECTED NOT DETECTED Final   A.calcoaceticus-baumannii NOT DETECTED NOT DETECTED Final   Bacteroides fragilis NOT DETECTED NOT DETECTED Final   Enterobacterales NOT DETECTED NOT DETECTED Final   Enterobacter cloacae complex NOT DETECTED NOT DETECTED Final   Escherichia coli  NOT DETECTED NOT DETECTED Final   Klebsiella aerogenes NOT DETECTED NOT DETECTED Final   Klebsiella oxytoca NOT DETECTED NOT DETECTED Final   Klebsiella pneumoniae NOT DETECTED NOT DETECTED Final   Proteus species NOT DETECTED NOT DETECTED Final   Salmonella species NOT DETECTED NOT DETECTED Final   Serratia marcescens NOT DETECTED NOT DETECTED Final   Haemophilus influenzae NOT DETECTED NOT DETECTED Final   Neisseria meningitidis NOT DETECTED NOT  DETECTED Final   Pseudomonas aeruginosa NOT DETECTED NOT DETECTED Final   Stenotrophomonas maltophilia NOT DETECTED NOT DETECTED Final   Candida albicans NOT DETECTED NOT DETECTED Final   Candida auris NOT DETECTED NOT DETECTED Final   Candida glabrata NOT DETECTED NOT DETECTED Final   Candida krusei NOT DETECTED NOT DETECTED Final   Candida parapsilosis NOT DETECTED NOT DETECTED Final   Candida tropicalis NOT DETECTED NOT DETECTED Final   Cryptococcus neoformans/gattii NOT DETECTED NOT DETECTED Final    Comment: Performed at [facility], [address] [place], [place] [address]     Labs: BNP (last 3 results) No results for input(s): BNP in the last 8760 hours. Basic Metabolic Panel: Recent Labs  Lab 03/06/20 1439 03/07/20 0033 03/08/20 0411 03/09/20 0500 03/10/20 0956  NA 135  --  136 137 138  K 3.8  --  4.0 4.7 4.0  CL 96*  --  101 104 104  CO2 27  --  23 24 22   GLUCOSE 122*  --  171* 153* 192*  BUN 8  --  16 13 17   CREATININE 0.95 0.88 0.82 0.91 0.92  CALCIUM 9.0  --  8.9 8.9 8.9  MG  --   --  2.4  --   --    Liver Function Tests: Recent Labs  Lab 03/06/20 1956 03/08/20 0411 03/09/20 0500 03/10/20 0956  AST 257* 98* 78* 81*  ALT 253* 175* 158* 180*  ALKPHOS 92 84 75 81  BILITOT 1.2 0.7 0.9 1.0  PROT 6.7 6.1* 5.9* 6.1*  ALBUMIN 3.4* 3.0* 3.0* 3.2*   No results for input(s): LIPASE, AMYLASE in the last 168 hours. No results for input(s): AMMONIA in the last 168 hours. CBC: Recent Labs  Lab 03/06/20 1439  03/07/20 0033 03/08/20 0411 03/09/20 0500 03/10/20 0956  WBC 7.2 6.7 13.1* 14.9* 14.1*  NEUTROABS  --   --  11.1* 12.9* 11.7*  HGB 16.7 15.0 14.6 14.2 15.2  HCT 47.3 42.8 40.6 40.7 43.8  MCV 87.4 86.8 87.5 86.8 86.7  PLT 175 169 216 224 287   Cardiac Enzymes: No results for input(s): CKTOTAL, CKMB, CKMBINDEX, TROPONINI in the last 168 hours. BNP: Invalid input(s): POCBNP CBG: No results for input(s): GLUCAP in the last 168 hours. D-Dimer Recent Labs    03/09/20 0500 03/10/20 0956  DDIMER 0.64* 0.62*   Hgb A1c No results for input(s): HGBA1C in the last 72 hours. Lipid Profile No results for input(s): CHOL, HDL, LDLCALC, TRIG, CHOLHDL, LDLDIRECT in the last 72 hours. Thyroid function studies No results for input(s): TSH, T4TOTAL, T3FREE, THYROIDAB in the last 72 hours.  Invalid input(s): FREET3 Anemia work up Recent Labs    03/09/20 0500 03/10/20 0956  FERRITIN 3,844* 3,074*   Urinalysis No results found for: COLORURINE, APPEARANCEUR, LABSPEC, PHURINE, GLUCOSEU, HGBUR, BILIRUBINUR, KETONESUR, PROTEINUR, UROBILINOGEN, NITRITE, LEUKOCYTESUR Sepsis Labs Invalid input(s): PROCALCITONIN,  WBC,  LACTICIDVEN Microbiology Recent Results (from the past 240 hour(s))  SARS Coronavirus 2 by RT PCR (hospital order, performed in [facility] hospital lab) Nasopharyngeal Nasopharyngeal Swab     Status: Abnormal   Collection Time: 03/06/20  7:56 PM   Specimen: Nasopharyngeal Swab  Result Value Ref Range Status   SARS Coronavirus 2 POSITIVE (A) NEGATIVE Final    Comment: RESULT CALLED TO, READ BACK BY AND VERIFIED WITH: A COLEMAN RN 2222 03/06/20 A BROWNING (NOTE) SARS-CoV-2 target nucleic acids are DETECTED  SARS-CoV-2 RNA is generally detectable in upper respiratory specimens  during the  acute phase of infection.  Positive results are indicative  of the presence of the identified virus, but do not rule out bacterial infection or co-infection with other pathogens not detected  by the test.  Clinical correlation with patient history and  other diagnostic information is necessary to determine patient infection status.  The expected result is negative.  Fact Sheet for Patients:   BoilerBrush.com.cy   Fact Sheet for Healthcare Providers:   https://pope.com/    This test is not yet approved or cleared by the [place] FDA and  has been authorized for detection and/or diagnosis of SARS-CoV-2 by FDA under an Emergency Use Authorization (EUA).  This EUA will remain in effect (meaning this  test can be used) for the duration of  the COVID-19 declaration under Section 564(b)(1) of the Act, 21 U.S.C. section 360-bbb-3(b)(1), unless the authorization is terminated or revoked sooner.  Performed at [facility], [address] [place], [place] [address]   Blood Culture (routine x 2)     Status: Abnormal   Collection Time: 03/06/20  7:56 PM   Specimen: BLOOD  Result Value Ref Range Status   Specimen Description BLOOD LEFT ARM  Final   Special Requests   Final    BOTTLES DRAWN AEROBIC AND ANAEROBIC Blood Culture adequate volume   Culture  Setup Time   Final    GRAM POSITIVE COCCI IN CLUSTERS AEROBIC BOTTLE ONLY Organism ID to follow CRITICAL RESULT CALLED TO, READ BACK BY AND VERIFIED WITH: ANDREW MEYER PHARMD  03/08/20 EB    Culture (A)  Final    STAPHYLOCOCCUS CAPITIS THE SIGNIFICANCE OF ISOLATING THIS ORGANISM FROM A SINGLE SET OF BLOOD CULTURES WHEN MULTIPLE SETS ARE DRAWN IS UNCERTAIN. PLEASE NOTIFY THE MICROBIOLOGY DEPARTMENT WITHIN ONE WEEK IF SPECIATION AND SENSITIVITIES ARE REQUIRED. Performed at [facility], [address], [place], [place] [address]    Report Status 03/09/2020 FINAL  Final  Blood Culture (routine x 2)     Status: None (Preliminary result)   Collection Time: 03/06/20  7:56 PM   Specimen: BLOOD  Result Value Ref Range Status   Specimen Description BLOOD RIGHT ARM   Final   Special Requests   Final    BOTTLES DRAWN AEROBIC AND ANAEROBIC Blood Culture adequate volume   Culture   Final    NO GROWTH 4 DAYS Performed at [facility], [address], [place], [place] [address]    Report Status PENDING  Incomplete  Blood Culture ID Panel (Reflexed)     Status: Abnormal   Collection Time: 03/06/20  7:56 PM  Result Value Ref Range Status   Enterococcus faecalis NOT DETECTED NOT DETECTED Final   Enterococcus Faecium NOT DETECTED NOT DETECTED Final   Listeria monocytogenes NOT DETECTED NOT DETECTED Final   Staphylococcus species DETECTED (A) NOT DETECTED Final    Comment: RESULT CALLED TO, READ BACK BY AND VERIFIED WITH: Harland German PHARMD  03/08/20 EB    Staphylococcus aureus (BCID) NOT DETECTED NOT DETECTED Final   Staphylococcus epidermidis NOT DETECTED NOT DETECTED Final   Staphylococcus lugdunensis NOT DETECTED NOT DETECTED Final   Streptococcus species NOT DETECTED NOT DETECTED Final   Streptococcus agalactiae NOT DETECTED NOT DETECTED Final   Streptococcus pneumoniae NOT DETECTED NOT DETECTED Final   Streptococcus pyogenes NOT DETECTED NOT DETECTED Final   A.calcoaceticus-baumannii NOT DETECTED NOT DETECTED Final   Bacteroides fragilis NOT DETECTED NOT DETECTED Final   Enterobacterales NOT DETECTED NOT DETECTED Final  Enterobacter cloacae complex NOT DETECTED NOT DETECTED Final   Escherichia coli NOT DETECTED NOT DETECTED Final   Klebsiella aerogenes NOT DETECTED NOT DETECTED Final   Klebsiella oxytoca NOT DETECTED NOT DETECTED Final   Klebsiella pneumoniae NOT DETECTED NOT DETECTED Final   Proteus species NOT DETECTED NOT DETECTED Final   Salmonella species NOT DETECTED NOT DETECTED Final   Serratia marcescens NOT DETECTED NOT DETECTED Final   Haemophilus influenzae NOT DETECTED NOT DETECTED Final   Neisseria meningitidis NOT DETECTED NOT DETECTED Final   Pseudomonas aeruginosa NOT DETECTED NOT DETECTED Final   Stenotrophomonas  maltophilia NOT DETECTED NOT DETECTED Final   Candida albicans NOT DETECTED NOT DETECTED Final   Candida auris NOT DETECTED NOT DETECTED Final   Candida glabrata NOT DETECTED NOT DETECTED Final   Candida krusei NOT DETECTED NOT DETECTED Final   Candida parapsilosis NOT DETECTED NOT DETECTED Final   Candida tropicalis NOT DETECTED NOT DETECTED Final   Cryptococcus neoformans/gattii NOT DETECTED NOT DETECTED Final    Comment: Performed at [facility], [address] [place], [place] [address]     Time coordinating discharge:  40 minutes  SIGNED:   Dorcas Carrow, MD  [facility] 03/10/2020, 12:28 PM

## 2020-03-11 LAB — CULTURE, BLOOD (ROUTINE X 2)
Culture: NO GROWTH
Special Requests: ADEQUATE

## 2020-03-15 DIAGNOSIS — J9600 Acute respiratory failure, unspecified whether with hypoxia or hypercapnia: Secondary | ICD-10-CM | POA: Diagnosis not present

## 2020-03-15 DIAGNOSIS — J069 Acute upper respiratory infection, unspecified: Secondary | ICD-10-CM | POA: Diagnosis not present

## 2020-03-15 DIAGNOSIS — U071 COVID-19: Secondary | ICD-10-CM | POA: Diagnosis not present

## 2020-03-24 DIAGNOSIS — R2990 Unspecified symptoms and signs involving the nervous system: Secondary | ICD-10-CM | POA: Diagnosis not present

## 2020-03-24 DIAGNOSIS — B948 Sequelae of other specified infectious and parasitic diseases: Secondary | ICD-10-CM | POA: Diagnosis not present

## 2020-03-24 DIAGNOSIS — U071 COVID-19: Secondary | ICD-10-CM | POA: Diagnosis not present

## 2020-04-10 DIAGNOSIS — J9600 Acute respiratory failure, unspecified whether with hypoxia or hypercapnia: Secondary | ICD-10-CM | POA: Diagnosis not present

## 2020-04-10 DIAGNOSIS — J069 Acute upper respiratory infection, unspecified: Secondary | ICD-10-CM | POA: Diagnosis not present

## 2020-04-10 DIAGNOSIS — U071 COVID-19: Secondary | ICD-10-CM | POA: Diagnosis not present

## 2020-04-15 DIAGNOSIS — J9600 Acute respiratory failure, unspecified whether with hypoxia or hypercapnia: Secondary | ICD-10-CM | POA: Diagnosis not present

## 2020-04-15 DIAGNOSIS — J069 Acute upper respiratory infection, unspecified: Secondary | ICD-10-CM | POA: Diagnosis not present

## 2020-04-15 DIAGNOSIS — U071 COVID-19: Secondary | ICD-10-CM | POA: Diagnosis not present

## 2020-05-03 DIAGNOSIS — R0600 Dyspnea, unspecified: Secondary | ICD-10-CM | POA: Diagnosis not present

## 2020-05-03 DIAGNOSIS — Z8616 Personal history of COVID-19: Secondary | ICD-10-CM | POA: Diagnosis not present

## 2020-05-03 DIAGNOSIS — R002 Palpitations: Secondary | ICD-10-CM | POA: Diagnosis not present

## 2020-05-03 DIAGNOSIS — B948 Sequelae of other specified infectious and parasitic diseases: Secondary | ICD-10-CM | POA: Diagnosis not present

## 2020-05-03 DIAGNOSIS — Z7689 Persons encountering health services in other specified circumstances: Secondary | ICD-10-CM | POA: Diagnosis not present

## 2020-05-05 DIAGNOSIS — R002 Palpitations: Secondary | ICD-10-CM | POA: Diagnosis not present

## 2020-05-05 DIAGNOSIS — Z7689 Persons encountering health services in other specified circumstances: Secondary | ICD-10-CM | POA: Diagnosis not present

## 2020-05-05 DIAGNOSIS — R0600 Dyspnea, unspecified: Secondary | ICD-10-CM | POA: Diagnosis not present

## 2020-06-26 DIAGNOSIS — R002 Palpitations: Secondary | ICD-10-CM | POA: Diagnosis not present

## 2020-06-26 DIAGNOSIS — R0600 Dyspnea, unspecified: Secondary | ICD-10-CM | POA: Diagnosis not present

## 2020-06-26 DIAGNOSIS — Z8616 Personal history of COVID-19: Secondary | ICD-10-CM | POA: Diagnosis not present

## 2020-07-10 DIAGNOSIS — R002 Palpitations: Secondary | ICD-10-CM | POA: Diagnosis not present

## 2020-10-26 DIAGNOSIS — J019 Acute sinusitis, unspecified: Secondary | ICD-10-CM | POA: Diagnosis not present

## 2020-11-24 IMAGING — DX DG CHEST 1V PORT
1 series · 1 of 1 positions shown · non-contrast
Comparison: None.

CLINICAL DATA: Shortness of breath.

EXAM:
PORTABLE CHEST 1 VIEW

[chest]
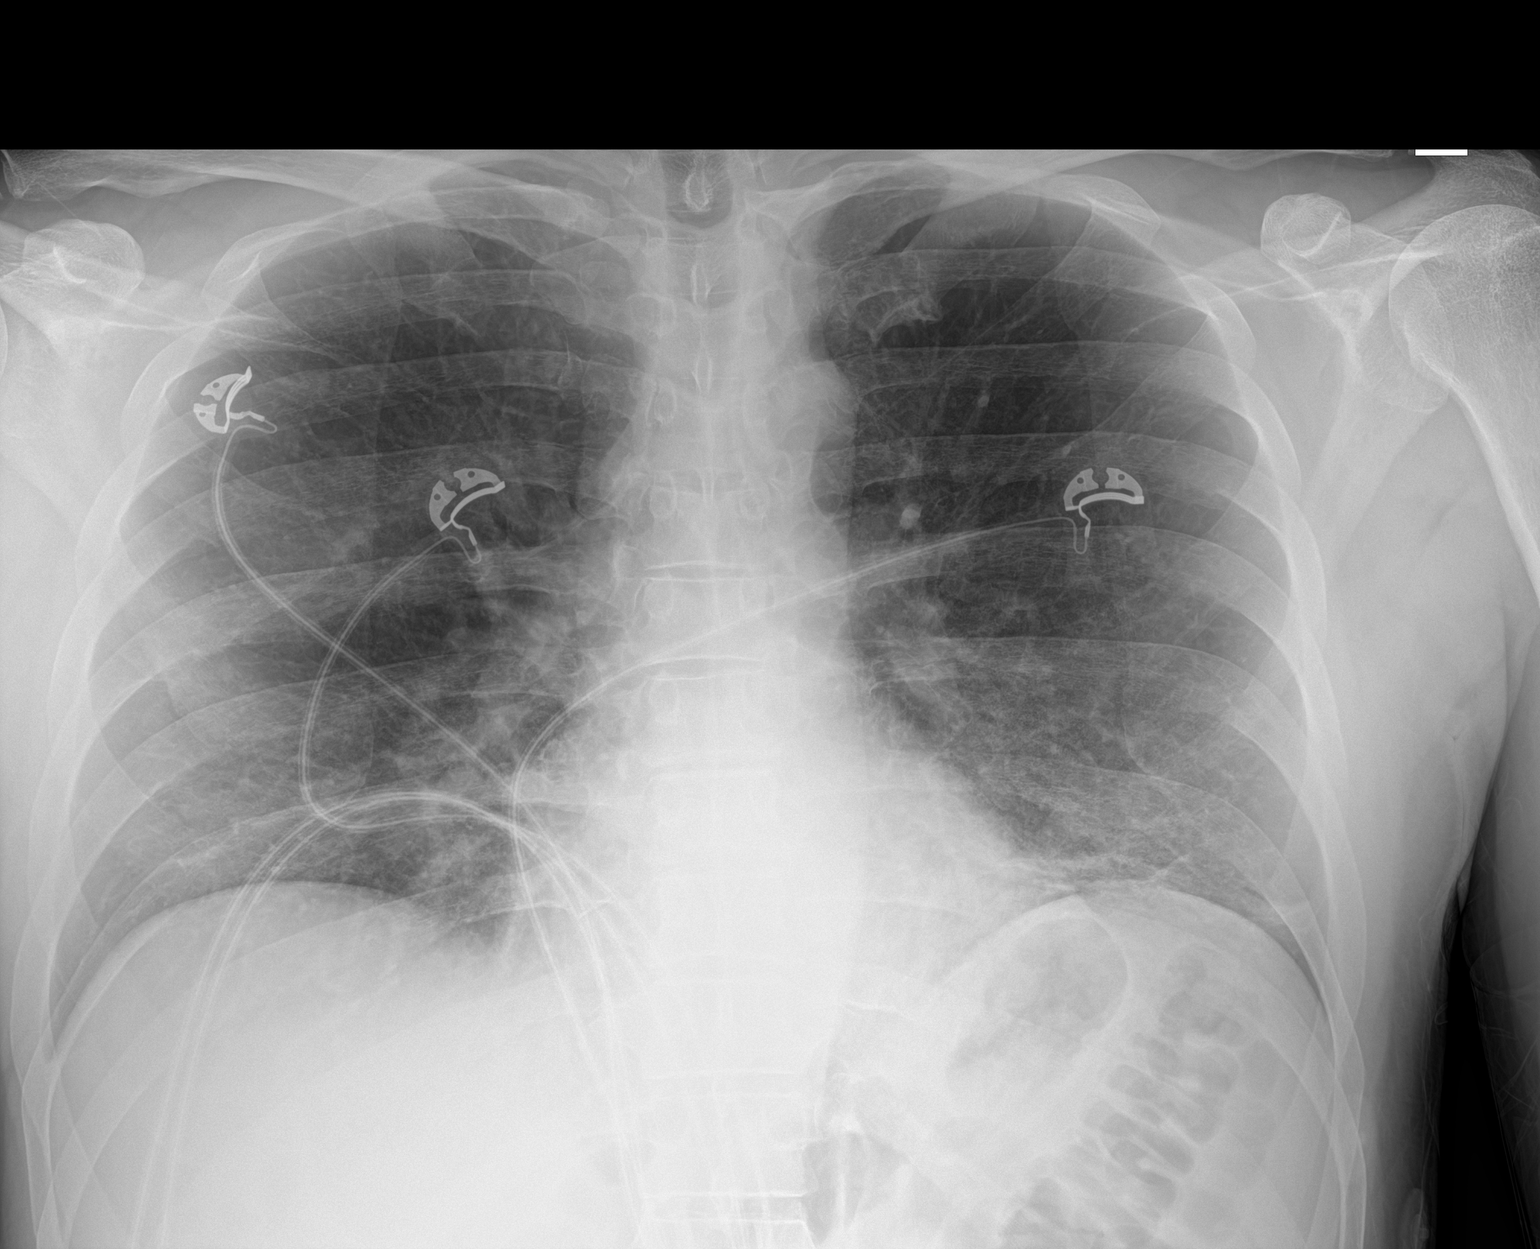

[1 of 1 positions shown; findings below may reference images not displayed]

FINDINGS: Mild atelectasis is seen within the bilateral lung bases. There is
no evidence of acute infiltrate, pleural effusion or pneumothorax.
The heart size and mediastinal contours are within normal limits.
The visualized skeletal structures are unremarkable.
IMPRESSION: Mild bibasilar atelectasis.

## 2021-04-10 DIAGNOSIS — K219 Gastro-esophageal reflux disease without esophagitis: Secondary | ICD-10-CM | POA: Diagnosis not present

## 2021-04-10 DIAGNOSIS — R809 Proteinuria, unspecified: Secondary | ICD-10-CM | POA: Diagnosis not present

## 2021-04-10 DIAGNOSIS — F10231 Alcohol dependence with withdrawal delirium: Secondary | ICD-10-CM | POA: Diagnosis not present

## 2021-04-10 DIAGNOSIS — F419 Anxiety disorder, unspecified: Secondary | ICD-10-CM | POA: Diagnosis not present

## 2021-04-10 DIAGNOSIS — R2681 Unsteadiness on feet: Secondary | ICD-10-CM | POA: Diagnosis not present

## 2021-04-10 DIAGNOSIS — R634 Abnormal weight loss: Secondary | ICD-10-CM | POA: Diagnosis not present

## 2021-04-10 DIAGNOSIS — K7460 Unspecified cirrhosis of liver: Secondary | ICD-10-CM | POA: Diagnosis not present

## 2021-04-10 DIAGNOSIS — F329 Major depressive disorder, single episode, unspecified: Secondary | ICD-10-CM | POA: Diagnosis not present

## 2021-04-10 DIAGNOSIS — G629 Polyneuropathy, unspecified: Secondary | ICD-10-CM | POA: Diagnosis not present

## 2021-04-10 DIAGNOSIS — K7200 Acute and subacute hepatic failure without coma: Secondary | ICD-10-CM | POA: Diagnosis not present

## 2021-04-10 DIAGNOSIS — K259 Gastric ulcer, unspecified as acute or chronic, without hemorrhage or perforation: Secondary | ICD-10-CM | POA: Diagnosis not present

## 2021-04-10 DIAGNOSIS — Z8616 Personal history of COVID-19: Secondary | ICD-10-CM | POA: Diagnosis not present

## 2021-04-10 DIAGNOSIS — K7030 Alcoholic cirrhosis of liver without ascites: Secondary | ICD-10-CM | POA: Diagnosis not present

## 2021-04-10 DIAGNOSIS — G4700 Insomnia, unspecified: Secondary | ICD-10-CM | POA: Diagnosis not present

## 2021-04-10 DIAGNOSIS — F1020 Alcohol dependence, uncomplicated: Secondary | ICD-10-CM | POA: Diagnosis not present

## 2021-04-10 DIAGNOSIS — F4310 Post-traumatic stress disorder, unspecified: Secondary | ICD-10-CM | POA: Diagnosis not present

## 2021-04-12 ENCOUNTER — Encounter: Payer: Self-pay | Admitting: Emergency Medicine

## 2021-04-12 ENCOUNTER — Inpatient Hospital Stay (HOSPITAL_COMMUNITY): Payer: Federal, State, Local not specified - PPO

## 2021-04-12 ENCOUNTER — Encounter (HOSPITAL_COMMUNITY): Payer: Self-pay

## 2021-04-12 ENCOUNTER — Other Ambulatory Visit: Payer: Self-pay

## 2021-04-12 ENCOUNTER — Inpatient Hospital Stay (HOSPITAL_COMMUNITY)
Admission: EM | Admit: 2021-04-12 | Discharge: 2021-04-14 | DRG: 896 | Disposition: A | Discharge status: Home / Self Care | Payer: Federal, State, Local not specified - PPO | Source: Other Acute Inpatient Hospital | Attending: Internal Medicine | Admitting: Internal Medicine

## 2021-04-12 DIAGNOSIS — F4310 Post-traumatic stress disorder, unspecified: Secondary | ICD-10-CM | POA: Diagnosis not present

## 2021-04-12 DIAGNOSIS — K7290 Hepatic failure, unspecified without coma: Secondary | ICD-10-CM | POA: Diagnosis not present

## 2021-04-12 DIAGNOSIS — D696 Thrombocytopenia, unspecified: Secondary | ICD-10-CM | POA: Diagnosis not present

## 2021-04-12 DIAGNOSIS — R319 Hematuria, unspecified: Secondary | ICD-10-CM | POA: Diagnosis present

## 2021-04-12 DIAGNOSIS — K7460 Unspecified cirrhosis of liver: Secondary | ICD-10-CM

## 2021-04-12 DIAGNOSIS — D61818 Other pancytopenia: Secondary | ICD-10-CM | POA: Diagnosis present

## 2021-04-12 DIAGNOSIS — Z833 Family history of diabetes mellitus: Secondary | ICD-10-CM | POA: Diagnosis not present

## 2021-04-12 DIAGNOSIS — G9341 Metabolic encephalopathy: Secondary | ICD-10-CM | POA: Diagnosis present

## 2021-04-12 DIAGNOSIS — K7200 Acute and subacute hepatic failure without coma: Secondary | ICD-10-CM | POA: Diagnosis not present

## 2021-04-12 DIAGNOSIS — G4700 Insomnia, unspecified: Secondary | ICD-10-CM | POA: Diagnosis not present

## 2021-04-12 DIAGNOSIS — F10239 Alcohol dependence with withdrawal, unspecified: Secondary | ICD-10-CM | POA: Diagnosis not present

## 2021-04-12 DIAGNOSIS — R2681 Unsteadiness on feet: Secondary | ICD-10-CM | POA: Diagnosis not present

## 2021-04-12 DIAGNOSIS — F10231 Alcohol dependence with withdrawal delirium: Secondary | ICD-10-CM | POA: Diagnosis not present

## 2021-04-12 DIAGNOSIS — K760 Fatty (change of) liver, not elsewhere classified: Secondary | ICD-10-CM | POA: Diagnosis present

## 2021-04-12 DIAGNOSIS — Z880 Allergy status to penicillin: Secondary | ICD-10-CM

## 2021-04-12 DIAGNOSIS — K7030 Alcoholic cirrhosis of liver without ascites: Secondary | ICD-10-CM | POA: Diagnosis not present

## 2021-04-12 DIAGNOSIS — F419 Anxiety disorder, unspecified: Secondary | ICD-10-CM | POA: Diagnosis not present

## 2021-04-12 DIAGNOSIS — F1020 Alcohol dependence, uncomplicated: Secondary | ICD-10-CM | POA: Diagnosis not present

## 2021-04-12 DIAGNOSIS — R443 Hallucinations, unspecified: Secondary | ICD-10-CM | POA: Diagnosis present

## 2021-04-12 DIAGNOSIS — D6959 Other secondary thrombocytopenia: Secondary | ICD-10-CM | POA: Diagnosis present

## 2021-04-12 DIAGNOSIS — R7989 Other specified abnormal findings of blood chemistry: Secondary | ICD-10-CM | POA: Diagnosis present

## 2021-04-12 DIAGNOSIS — E876 Hypokalemia: Secondary | ICD-10-CM | POA: Diagnosis present

## 2021-04-12 DIAGNOSIS — R634 Abnormal weight loss: Secondary | ICD-10-CM | POA: Diagnosis not present

## 2021-04-12 DIAGNOSIS — R296 Repeated falls: Secondary | ICD-10-CM | POA: Diagnosis present

## 2021-04-12 DIAGNOSIS — R809 Proteinuria, unspecified: Secondary | ICD-10-CM | POA: Diagnosis not present

## 2021-04-12 DIAGNOSIS — K219 Gastro-esophageal reflux disease without esophagitis: Secondary | ICD-10-CM | POA: Diagnosis not present

## 2021-04-12 DIAGNOSIS — Z8616 Personal history of COVID-19: Secondary | ICD-10-CM | POA: Diagnosis not present

## 2021-04-12 DIAGNOSIS — R410 Disorientation, unspecified: Secondary | ICD-10-CM | POA: Diagnosis not present

## 2021-04-12 DIAGNOSIS — K259 Gastric ulcer, unspecified as acute or chronic, without hemorrhage or perforation: Secondary | ICD-10-CM | POA: Diagnosis not present

## 2021-04-12 DIAGNOSIS — F10221 Alcohol dependence with intoxication delirium: Secondary | ICD-10-CM | POA: Diagnosis not present

## 2021-04-12 DIAGNOSIS — F10939 Alcohol use, unspecified with withdrawal, unspecified: Secondary | ICD-10-CM

## 2021-04-12 DIAGNOSIS — G629 Polyneuropathy, unspecified: Secondary | ICD-10-CM | POA: Diagnosis not present

## 2021-04-12 DIAGNOSIS — Z20822 Contact with and (suspected) exposure to covid-19: Secondary | ICD-10-CM | POA: Diagnosis present

## 2021-04-12 DIAGNOSIS — R9431 Abnormal electrocardiogram [ECG] [EKG]: Secondary | ICD-10-CM | POA: Diagnosis not present

## 2021-04-12 DIAGNOSIS — F19939 Other psychoactive substance use, unspecified with withdrawal, unspecified: Secondary | ICD-10-CM | POA: Diagnosis not present

## 2021-04-12 DIAGNOSIS — Z0389 Encounter for observation for other suspected diseases and conditions ruled out: Secondary | ICD-10-CM | POA: Diagnosis not present

## 2021-04-12 DIAGNOSIS — F329 Major depressive disorder, single episode, unspecified: Secondary | ICD-10-CM | POA: Diagnosis not present

## 2021-04-12 HISTORY — DX: Alcohol dependence, uncomplicated: F10.20

## 2021-04-12 LAB — URINALYSIS, ROUTINE W REFLEX MICROSCOPIC
Bacteria, UA: NONE SEEN
Bilirubin Urine: NEGATIVE
Glucose, UA: NEGATIVE mg/dL
Ketones, ur: NEGATIVE mg/dL
Leukocytes,Ua: NEGATIVE
Nitrite: NEGATIVE
Protein, ur: NEGATIVE mg/dL
RBC / HPF: >50 RBC/hpf — ABNORMAL HIGH (ref 0–5)
Specific Gravity, Urine: 1.009 (ref 1.005–1.030)
pH: 8 (ref 5.0–8.0)

## 2021-04-12 LAB — CBC WITH DIFFERENTIAL/PLATELET
Abs Immature Granulocytes: 0.02 10*3/uL (ref 0.00–0.07)
Basophils Absolute: 0 10*3/uL (ref 0.0–0.1)
Basophils Relative: 1 %
Eosinophils Absolute: 0.1 10*3/uL (ref 0.0–0.5)
Eosinophils Relative: 2 %
HCT: 38.6 % — ABNORMAL LOW (ref 39.0–52.0)
Hemoglobin: 13.4 g/dL (ref 13.0–17.0)
Immature Granulocytes: 1 %
Lymphocytes Relative: 29 %
Lymphs Abs: 1.1 10*3/uL (ref 0.7–4.0)
MCH: 32.4 pg (ref 26.0–34.0)
MCHC: 34.7 g/dL (ref 30.0–36.0)
MCV: 93.2 fL (ref 80.0–100.0)
Monocytes Absolute: 0.5 10*3/uL (ref 0.1–1.0)
Monocytes Relative: 12 %
Neutro Abs: 2.2 10*3/uL (ref 1.7–7.7)
Neutrophils Relative %: 55 %
Platelets: 87 10*3/uL — ABNORMAL LOW (ref 150–400)
RBC: 4.14 MIL/uL — ABNORMAL LOW (ref 4.22–5.81)
RDW: 12.8 % (ref 11.5–15.5)
WBC: 4 10*3/uL (ref 4.0–10.5)
nRBC: 0 % (ref 0.0–0.2)

## 2021-04-12 LAB — BASIC METABOLIC PANEL
Anion gap: 12 (ref 5–15)
BUN: 9 mg/dL (ref 6–20)
CO2: 27 mmol/L (ref 22–32)
Calcium: 10.1 mg/dL (ref 8.9–10.3)
Chloride: 98 mmol/L (ref 98–111)
Creatinine, Ser: 0.78 mg/dL (ref 0.61–1.24)
GFR, Estimated: >60 mL/min (ref >60)
Glucose, Bld: 99 mg/dL (ref 70–99)
Potassium: 3.5 mmol/L (ref 3.5–5.1)
Sodium: 137 mmol/L (ref 135–145)

## 2021-04-12 LAB — IRON AND TIBC
Iron: 185 ug/dL — ABNORMAL HIGH (ref 45–182)
Saturation Ratios: 81 % — ABNORMAL HIGH (ref 17.9–39.5)
TIBC: 228 ug/dL — ABNORMAL LOW (ref 250–450)
UIBC: 43 ug/dL

## 2021-04-12 LAB — HEPATIC FUNCTION PANEL
ALT: 95 U/L — ABNORMAL HIGH (ref 0–44)
AST: 125 U/L — ABNORMAL HIGH (ref 15–41)
Albumin: 3.8 g/dL (ref 3.5–5.0)
Alkaline Phosphatase: 59 U/L (ref 38–126)
Bilirubin, Direct: 0.2 mg/dL (ref 0.0–0.2)
Indirect Bilirubin: 0.8 mg/dL (ref 0.3–0.9)
Total Bilirubin: 1 mg/dL (ref 0.3–1.2)
Total Protein: 6.3 g/dL — ABNORMAL LOW (ref 6.5–8.1)

## 2021-04-12 LAB — RESP PANEL BY RT-PCR (FLU A&B, COVID) ARPGX2
Influenza A by PCR: NEGATIVE
Influenza B by PCR: NEGATIVE
SARS Coronavirus 2 by RT PCR: NEGATIVE

## 2021-04-12 LAB — FERRITIN: Ferritin: 4228 ng/mL — ABNORMAL HIGH (ref 24–336)

## 2021-04-12 LAB — AMMONIA: Ammonia: 109 umol/L — ABNORMAL HIGH (ref 9–35)

## 2021-04-12 LAB — CK: Total CK: 285 U/L (ref 49–397)

## 2021-04-12 LAB — FOLATE: Folate: 12.2 ng/mL (ref >5.9)

## 2021-04-12 LAB — PROTIME-INR
INR: 1 (ref 0.8–1.2)
Prothrombin Time: 13.5 seconds (ref 11.4–15.2)

## 2021-04-12 LAB — VITAMIN B12: Vitamin B-12: 776 pg/mL (ref 180–914)

## 2021-04-12 LAB — MAGNESIUM: Magnesium: 1.5 mg/dL — ABNORMAL LOW (ref 1.7–2.4)

## 2021-04-12 LAB — HIV ANTIBODY (ROUTINE TESTING W REFLEX): HIV Screen 4th Generation wRfx: NONREACTIVE

## 2021-04-12 MED ORDER — SODIUM CHLORIDE 0.9 % IV SOLN: INTRAVENOUS | Status: DC

## 2021-04-12 MED ORDER — LORAZEPAM 2 MG/ML IJ SOLN: 0.0000 mg | Freq: Two times a day (BID) | INTRAMUSCULAR | Status: DC

## 2021-04-12 MED ORDER — LORAZEPAM 2 MG/ML IJ SOLN
2.0000 mg | Freq: Once | INTRAMUSCULAR | Status: AC
  Administered 2021-04-12: 2 mg via INTRAVENOUS
  Filled 2021-04-12: qty 1

## 2021-04-12 MED ORDER — POTASSIUM CHLORIDE CRYS ER 20 MEQ PO TBCR
40.0000 meq | EXTENDED_RELEASE_TABLET | Freq: Two times a day (BID) | ORAL | Status: DC
  Administered 2021-04-12: 40 meq via ORAL
  Filled 2021-04-12: qty 2

## 2021-04-12 MED ORDER — ONDANSETRON HCL 4 MG/2ML IJ SOLN: 4.0000 mg | Freq: Four times a day (QID) | INTRAMUSCULAR | Status: DC | PRN

## 2021-04-12 MED ORDER — POTASSIUM CHLORIDE IN NACL 20-0.45 MEQ/L-% IV SOLN
INTRAVENOUS | Status: DC
  Filled 2021-04-12 (×2): qty 1000

## 2021-04-12 MED ORDER — ONDANSETRON HCL 4 MG PO TABS: 4.0000 mg | ORAL_TABLET | Freq: Four times a day (QID) | ORAL | Status: DC | PRN

## 2021-04-12 MED ORDER — SODIUM CHLORIDE 0.9 % IV SOLN
260.0000 mg | Freq: Once | INTRAVENOUS | Status: AC
  Administered 2021-04-12: 260 mg via INTRAVENOUS
  Filled 2021-04-12: qty 2

## 2021-04-12 MED ORDER — MAGNESIUM OXIDE -MG SUPPLEMENT 400 (240 MG) MG PO TABS
800.0000 mg | ORAL_TABLET | Freq: Once | ORAL | Status: AC
  Administered 2021-04-12: 800 mg via ORAL
  Filled 2021-04-12: qty 2

## 2021-04-12 MED ORDER — LORAZEPAM 2 MG/ML IJ SOLN
0.0000 mg | Freq: Four times a day (QID) | INTRAMUSCULAR | Status: AC
  Administered 2021-04-12: 4 mg via INTRAVENOUS
  Administered 2021-04-13: 2 mg via INTRAVENOUS
  Filled 2021-04-12: qty 2
  Filled 2021-04-12: qty 1

## 2021-04-12 MED ORDER — ACETAMINOPHEN 650 MG RE SUPP: 650.0000 mg | Freq: Four times a day (QID) | RECTAL | Status: DC | PRN

## 2021-04-12 MED ORDER — SODIUM CHLORIDE 0.9 % IV BOLUS
1000.0000 mL | Freq: Once | INTRAVENOUS | Status: AC
  Administered 2021-04-12: 1000 mL via INTRAVENOUS

## 2021-04-12 MED ORDER — LORAZEPAM 2 MG/ML IJ SOLN
1.0000 mg | Freq: Once | INTRAMUSCULAR | Status: AC
  Administered 2021-04-12: 1 mg via INTRAVENOUS
  Filled 2021-04-12: qty 1

## 2021-04-12 MED ORDER — THIAMINE HCL 100 MG/ML IJ SOLN: 100.0000 mg | INTRAMUSCULAR | Status: DC

## 2021-04-12 MED ORDER — THIAMINE HCL 100 MG/ML IJ SOLN
100.0000 mg | Freq: Every day | INTRAMUSCULAR | Status: DC
  Administered 2021-04-12: 100 mg via INTRAVENOUS
  Filled 2021-04-12: qty 2

## 2021-04-12 MED ORDER — LORAZEPAM 1 MG PO TABS: 0.0000 mg | ORAL_TABLET | Freq: Four times a day (QID) | ORAL | Status: AC

## 2021-04-12 MED ORDER — LORAZEPAM 1 MG PO TABS: 0.0000 mg | ORAL_TABLET | Freq: Two times a day (BID) | ORAL | Status: DC

## 2021-04-12 MED ORDER — MAGNESIUM SULFATE 2 GM/50ML IV SOLN
2.0000 g | Freq: Once | INTRAVENOUS | Status: AC
  Administered 2021-04-12: 2 g via INTRAVENOUS
  Filled 2021-04-12: qty 50

## 2021-04-12 MED ORDER — LORAZEPAM 2 MG/ML IJ SOLN
4.0000 mg | Freq: Once | INTRAMUSCULAR | Status: AC
  Administered 2021-04-12: 4 mg via INTRAVENOUS
  Filled 2021-04-12: qty 2

## 2021-04-12 MED ORDER — ACETAMINOPHEN 325 MG PO TABS: 650.0000 mg | ORAL_TABLET | Freq: Four times a day (QID) | ORAL | Status: DC | PRN

## 2021-04-12 MED ORDER — PHENOBARBITAL SODIUM 65 MG/ML IJ SOLN: 65.0000 mg | Freq: Three times a day (TID) | INTRAMUSCULAR | Status: DC

## 2021-04-12 MED ORDER — ALBUTEROL SULFATE (2.5 MG/3ML) 0.083% IN NEBU: 2.5000 mg | INHALATION_SOLUTION | Freq: Four times a day (QID) | RESPIRATORY_TRACT | Status: DC | PRN

## 2021-04-12 MED ORDER — THIAMINE HCL 100 MG/ML IJ SOLN
500.0000 mg | Freq: Three times a day (TID) | INTRAVENOUS | Status: AC
  Administered 2021-04-12 – 2021-04-14 (×6): 500 mg via INTRAVENOUS
  Filled 2021-04-12 (×7): qty 5

## 2021-04-12 MED ORDER — PHENOBARBITAL SODIUM 130 MG/ML IJ SOLN
130.0000 mg | Freq: Three times a day (TID) | INTRAMUSCULAR | Status: DC
  Administered 2021-04-13 – 2021-04-14 (×5): 130 mg via INTRAVENOUS
  Filled 2021-04-12 (×5): qty 1

## 2021-04-12 MED ORDER — PHENOBARBITAL SODIUM 65 MG/ML IJ SOLN: 65.0000 mg | Freq: Every day | INTRAMUSCULAR | Status: DC

## 2021-04-12 MED ORDER — THIAMINE HCL 100 MG PO TABS
100.0000 mg | ORAL_TABLET | Freq: Every day | ORAL | Status: DC
  Administered 2021-04-12: 100 mg via ORAL
  Filled 2021-04-12: qty 1

## 2021-04-12 MED ORDER — CHLORDIAZEPOXIDE HCL 25 MG PO CAPS
100.0000 mg | ORAL_CAPSULE | Freq: Once | ORAL | Status: AC
  Administered 2021-04-12: 100 mg via ORAL
  Filled 2021-04-12: qty 4

## 2021-04-12 MED ORDER — SODIUM CHLORIDE 0.9% FLUSH
3.0000 mL | Freq: Two times a day (BID) | INTRAVENOUS | Status: DC
  Administered 2021-04-13: 3 mL via INTRAVENOUS

## 2021-04-12 NOTE — ED Notes (Signed)
Got patient undressed on the monitor did ekg shown to Dr Adela Lank patient is resting with call bell in reach patient is IN A GOWN

## 2021-04-12 NOTE — ED Triage Notes (Signed)
"  From [facility] x 1 week, they said he has an increase in tremors and their doctor wanted him checked for possible dehydration" per EMS  [facility] phone number [phone] They said they will pick him up at discharge

## 2021-04-12 NOTE — ED Provider Notes (Signed)
[facility] EMERGENCY DEPARTMENT Provider Note   CSN: [id number] Arrival date & time: 04/12/21  1004     History Chief Complaint  Patient presents with   Tremors    Brad Peterson is a 45 y.o. male.  45 yo M with a cc of tremor and fatigue.  Is been going on for about a week now.  [facility] in [facility].  He has been on the medications there and has been doing well but off and on has been having worsening shaking and leg fatigue.  He denies infectious symptoms denies cough congestion or fever denies nausea vomiting or diarrhea.  Feels like he has been eating and drinking okay.  He was told that he might be dehydrated need to come here for evaluation.  Initially declined and eventually relented.  The history is provided by the patient.  Illness Severity:  Moderate Onset quality:  Gradual Duration:  1 week Timing:  Constant Progression:  Waxing and waning Chronicity:  New Associated symptoms: no abdominal pain, no chest pain, no congestion, no diarrhea, no fever, no headaches, no myalgias, no rash, no shortness of breath and no vomiting       Past Medical History:  Diagnosis Date   Alcoholism [facility])     Patient Active Problem List   Diagnosis Date Noted   Alcoholism (HCC) 04/12/2021   Alcohol withdrawal (HCC) 04/12/2021   Acute respiratory failure due to COVID-19 (HCC) 03/06/2020   Elevated LFTs 03/06/2020   Acute respiratory disease due to COVID-19 virus 03/06/2020    History reviewed. No pertinent surgical history.     Family History  Problem Relation Age of Onset   Diabetes Mellitus II Neg Hx     Social History   Tobacco Use   Smoking status: Never   Smokeless tobacco: Never  Substance Use Topics   Alcohol use: Yes    Comment: Drinks beer daily.   Drug use: Never    Home Medications Prior to Admission medications   Not on File    Allergies    Amoxicillin  Review of Systems   Review  of Systems  Constitutional:  Negative for chills and fever.  HENT:  Negative for congestion and facial swelling.   Eyes:  Negative for discharge and visual disturbance.  Respiratory:  Negative for shortness of breath.   Cardiovascular:  Negative for chest pain and palpitations.  Gastrointestinal:  Negative for abdominal pain, diarrhea and vomiting.  Musculoskeletal:  Negative for arthralgias and myalgias.  Skin:  Negative for color change and rash.  Neurological:  Negative for tremors, syncope and headaches.  Psychiatric/Behavioral:  Negative for confusion and dysphoric mood.    Physical Exam Updated Vital Signs BP (!) 141/113   Pulse 86   Temp 99 F (37.2 C) (Oral)   Resp 20   Ht 6\' 2"  (1.88 m)   Wt 87.1 kg   SpO2 98%   BMI 24.65 kg/m   Physical Exam Vitals and nursing note reviewed.  Constitutional:      Appearance: He is well-developed.  HENT:     Head: Normocephalic and atraumatic.  Eyes:     Pupils: Pupils are equal, round, and reactive to light.  Neck:     Vascular: No JVD.  Cardiovascular:     Rate and Rhythm: Regular rhythm. Tachycardia present.     Heart sounds: No murmur heard.   No friction rub. No gallop.  Pulmonary:     Effort: No  respiratory distress.     Breath sounds: No wheezing.  Abdominal:     General: There is no distension.     Tenderness: There is no abdominal tenderness. There is no guarding or rebound.  Musculoskeletal:        General: Normal range of motion.     Cervical back: Normal range of motion and neck supple.  Skin:    Coloration: Skin is not pale.     Findings: No rash.  Neurological:     Mental Status: He is alert and oriented to person, place, and time.     Comments: Tremulous, worse in the hands and arms.  Mild cogwheeling with the right upper extremity but none in the left.  Reflexes are mildly diminished in all extremities.  +1.  Psychiatric:        Behavior: Behavior normal.    ED Results / Procedures / Treatments    Labs (all labs ordered are listed, but only abnormal results are displayed) Labs Reviewed  CBC WITH DIFFERENTIAL/PLATELET - Abnormal; Notable for the following components:      Result Value   RBC 4.14 (*)    HCT 38.6 (*)    Platelets 87 (*)    All other components within normal limits  MAGNESIUM - Abnormal; Notable for the following components:   Magnesium 1.5 (*)    All other components within normal limits  URINALYSIS, ROUTINE W REFLEX MICROSCOPIC - Abnormal; Notable for the following components:   Hgb urine dipstick MODERATE (*)    RBC / HPF >50 (*)    All other components within normal limits  RESP PANEL BY RT-PCR (FLU A&B, COVID) [id number]  BASIC METABOLIC PANEL  HEPATIC FUNCTION PANEL  AMMONIA  VITAMIN B12  FOLATE  FERRITIN  IRON AND TIBC  CK  PROTIME-INR    EKG EKG Interpretation  Date/Time:  Thursday April 12 2021 10:11:00 EDT Ventricular Rate:  99 PR Interval:  175 QRS Duration: 86 QT Interval:  348 QTC Calculation: 447 R Axis:   81 Text Interpretation: Sinus rhythm Probable left atrial enlargement Baseline wander TECHNICALLY DIFFICULT Otherwise no significant change Confirmed by Melene Plan [phone]) on 04/12/2021 10:27:14 AM  Radiology No results found.  Procedures Procedures   Medications Ordered in ED Medications  LORazepam (ATIVAN) injection 0-4 mg ( Intravenous See Alternative 04/12/21 1450)    Or  LORazepam (ATIVAN) tablet 0-4 mg (0 mg Oral Not Given 04/12/21 1450)  LORazepam (ATIVAN) injection 0-4 mg (has no administration in time range)    Or  LORazepam (ATIVAN) tablet 0-4 mg (has no administration in time range)  PHENObarbital (LUMINAL) 260 mg in sodium chloride 0.9 % 100 mL IVPB (has no administration in time range)  PHENObarbital (LUMINAL) injection 130 mg (has no administration in time range)    Followed by  PHENObarbital (LUMINAL) injection 65 mg (has no administration in time range)    Followed by  PHENObarbital (LUMINAL) injection 65 mg  (has no administration in time range)  potassium chloride SA (KLOR-CON) CR tablet 40 mEq (has no administration in time range)  thiamine 500mg  in normal saline (66ml) IVPB (has no administration in time range)    Followed by  thiamine (B-1) injection 100 mg (has no administration in time range)  sodium chloride 0.9 % bolus 1,000 mL (0 mLs Intravenous Stopped 04/12/21 1231)  LORazepam (ATIVAN) injection 1 mg (1 mg Intravenous Given 04/12/21 1104)  magnesium oxide (MAG-OX) tablet 800 mg (800 mg Oral Given 04/12/21 1330)  magnesium sulfate IVPB  2 g 50 mL (0 g Intravenous Stopped 04/12/21 1406)  LORazepam (ATIVAN) injection 2 mg (2 mg Intravenous Given 04/12/21 1329)  chlordiazePOXIDE (LIBRIUM) capsule 100 mg (100 mg Oral Given 04/12/21 1448)  LORazepam (ATIVAN) injection 4 mg (4 mg Intravenous Given 04/12/21 1418)    ED Course  I have reviewed the triage vital signs and the nursing notes.  Pertinent labs & imaging results that were available during my care of the patient were reviewed by me and considered in my medical decision making (see chart for details).    MDM Rules/Calculators/A&P                           45  yo M with a chief complaints of tremors and fatigue.  Patient is currently in alcohol rehabilitation program.  He is on Librium, Valium, Ativan, carbamazepine,  trazodone.  He is mildly tremulous on exam he is also mildly hypertensive and tachycardic.  Could be withdrawal symptoms as he is slowly tapering off of these medications.  Other possibility would be medication reaction.  I we will give a bolus of IV fluids and Ativan.  Reassess.  The patient with worsening tremors while in the ED.  I think most likely worsening withdrawal.  Given two more milligrams of Ativan.  The patient was reassessed and unfortunately had no significant improvements.  We will give now 4 mg of Ativan.  Will discuss with medicine.  Discussed with critical care feel the patient does not quite need ICU level  care.  Medicine to admit.  CRITICAL CARE Performed by: Rae Roam   Total critical care time: 80 minutes  Critical care time was exclusive of separately billable procedures and treating other patients.  Critical care was necessary to treat or prevent imminent or life-threatening deterioration.  Critical care was time spent personally by me on the following activities: development of treatment plan with patient and/or surrogate as well as nursing, discussions with consultants, evaluation of patient's response to treatment, examination of patient, obtaining history from patient or surrogate, ordering and performing treatments and interventions, ordering and review of laboratory studies, ordering and review of radiographic studies, pulse oximetry and re-evaluation of patient's condition.  The patients results and plan were reviewed and discussed.   Any x-rays performed were independently reviewed by myself.   Differential diagnosis were considered with the presenting HPI.  Medications  LORazepam (ATIVAN) injection 0-4 mg ( Intravenous See Alternative 04/12/21 1450)    Or  LORazepam (ATIVAN) tablet 0-4 mg (0 mg Oral Not Given 04/12/21 1450)  LORazepam (ATIVAN) injection 0-4 mg (has no administration in time range)    Or  LORazepam (ATIVAN) tablet 0-4 mg (has no administration in time range)  PHENObarbital (LUMINAL) 260 mg in sodium chloride 0.9 % 100 mL IVPB (has no administration in time range)  PHENObarbital (LUMINAL) injection 130 mg (has no administration in time range)    Followed by  PHENObarbital (LUMINAL) injection 65 mg (has no administration in time range)    Followed by  PHENObarbital (LUMINAL) injection 65 mg (has no administration in time range)  potassium chloride SA (KLOR-CON) CR tablet 40 mEq (has no administration in time range)  thiamine 500mg  in normal saline (88ml) IVPB (has no administration in time range)    Followed by  thiamine (B-1) injection 100 mg  (has no administration in time range)  sodium chloride 0.9 % bolus 1,000 mL (0 mLs Intravenous Stopped 04/12/21 1231)  LORazepam (  ATIVAN) injection 1 mg (1 mg Intravenous Given 04/12/21 1104)  magnesium oxide (MAG-OX) tablet 800 mg (800 mg Oral Given 04/12/21 1330)  magnesium sulfate IVPB 2 g 50 mL (0 g Intravenous Stopped 04/12/21 1406)  LORazepam (ATIVAN) injection 2 mg (2 mg Intravenous Given 04/12/21 1329)  chlordiazePOXIDE (LIBRIUM) capsule 100 mg (100 mg Oral Given 04/12/21 1448)  LORazepam (ATIVAN) injection 4 mg (4 mg Intravenous Given 04/12/21 1418)    Vitals:   04/12/21 1014 04/12/21 1100 04/12/21 1145 04/12/21 1230  BP: (!) 144/102 (!) 133/102 (!) 135/104 (!) 141/113  Pulse:  (!) 105 85 86  Resp:  18 19 20   Temp:      TempSrc:      SpO2:  96% 97% 98%  Weight:      Height:        Final diagnoses:  Alcohol withdrawal syndrome with complication (HCC)    Admission/ observation were discussed with the admitting physician, patient and/or family and they are comfortable with the plan.    Final Clinical Impression(s) / ED Diagnoses Final diagnoses:  Alcohol withdrawal syndrome with complication [facility])    Rx / DC Orders ED Discharge Orders     None        [facility], DO 04/12/21 1548

## 2021-04-12 NOTE — Consult Note (Signed)
NAME:  Brad Peterson, MRN:  [id number], DOB:  [birth year], LOS: 0 ADMISSION DATE:  04/12/2021, CONSULTATION DATE:  04/12/21 REFERRING MD:  EDP, CHIEF COMPLAINT:  tremors   History of Present Illness:  45 year old man with hx of heavy alcohol abuse, prior COVID infection presenting with increased tremors and confusion from rehab.  Received ativan x 7 mg and librium 100mg  x 1.  PCCM asked to consult given worsening w/d symptoms.  Patient currently giggling and passing gas.  Pertinent  Medical History  Alcohol abuse  Significant Hospital Events: Including procedures, antibiotic start and stop dates in addition to other pertinent events   04/12/21: to ICU  Interim History / Subjective:  Consulted wife at bedside.  Objective   Blood pressure (!) 141/113, pulse 86, temperature 99 F (37.2 C), temperature source Oral, resp. rate 20, height 6\' 2"  (1.88 m), weight 87.1 kg, SpO2 98 %.        Intake/Output Summary (Last 24 hours) at 04/12/2021 1507 Last data filed at 04/12/2021 1406 Gross per 24 hour  Intake 923.8 ml  Output --  Net 923.8 ml   Filed Weights   04/12/21 1008  Weight: 87.1 kg    Examination: General: tremulous nontoxic appearing man trying to eat pulse oximeter HENT: MMM, trachea midline Lungs: Clear to auscultation, no accessory muscle use Cardiovascular: RRR, ext warm Abdomen: Soft, +BS Extremities: no edema or stigmata of arthritis Neuro: Moves all 4 ext to command but requires repeated prompting, restless, hard to understand Skin: no rashes of jaundice  Mg/K low repleting Has pancytopenia, macrocytic  Resolved Hospital Problem list   N/a  Assessment & Plan:  Acute metabolic encephalopathy in setting of alcohol abstinence in heavy drinker- 1 fifth liquor per day New macrocytic pancytopenia question alcohol effect Hypomagnesemia, hypokalemia- being repleted Liver lesion noted 2008 thought to be hemangioma  - Check INR, liver function tests, B12, B9, iron  panel, RUQ 04/14/21 - Start phenobarb taper with PRN ativan on top, goal RASS -1 and CIWA < 5 - No need for ICU transfer at this time, having wife or sitter at bedside may be helpful - Seizure precautions - NPO until more oriented - We will check in peripherally tomorrow, call if issues  Best Practice (right click and "Reselect all SmartList Selections" daily)   Per primary  Labs   CBC: Recent Labs  Lab 04/12/21 1100  WBC 4.0  NEUTROABS 2.2  HGB 13.4  HCT 38.6*  MCV 93.2  PLT 87*    Basic Metabolic Panel: Recent Labs  Lab 04/12/21 1100  NA 137  K 3.5  CL 98  CO2 27  GLUCOSE 99  BUN 9  CREATININE 0.78  CALCIUM 10.1  MG 1.5*   GFR: Estimated Creatinine Clearance: 135.6 mL/min (by C-G formula based on SCr of 0.78 mg/dL). Recent Labs  Lab 04/12/21 1100  WBC 4.0    Liver Function Tests: No results for input(s): AST, ALT, ALKPHOS, BILITOT, PROT, ALBUMIN in the last 168 hours. No results for input(s): LIPASE, AMYLASE in the last 168 hours. No results for input(s): AMMONIA in the last 168 hours.  ABG No results found for: PHART, PCO2ART, PO2ART, HCO3, TCO2, ACIDBASEDEF, O2SAT   Coagulation Profile: No results for input(s): INR, PROTIME in the last 168 hours.  Cardiac Enzymes: No results for input(s): CKTOTAL, CKMB, CKMBINDEX, TROPONINI in the last 168 hours.  HbA1C: No results found for: HGBA1C  CBG: No results for input(s): GLUCAP in the last 168 hours.  Review of Systems:   Cannot assess, too confused  Past Medical History:  He,  has a past medical history of Alcoholism (HCC).   Surgical History:  History reviewed. No pertinent surgical history.   Social History:   reports that he has never smoked. He has never used smokeless tobacco. He reports current alcohol use. He reports that he does not use drugs.   Family History:  His family history is negative for Diabetes Mellitus II.   Allergies Allergies  Allergen Reactions   Amoxicillin Rash      Home Medications  Prior to Admission medications   Not on File

## 2021-04-12 NOTE — ED Notes (Signed)
Very agitated and confused. Rolling around in bed. Wife at bedside. MD informed.

## 2021-04-12 NOTE — H&P (Addendum)
History and Physical    Brad Peterson EHM:[id number] DOB: [birth year] DOA: 04/12/2021  Referring MD/NP/PA: Melene Plan, DO PCP: Clementeen Graham, PA-C  [facility] from[facility]  Chief Complaint: Confusion  I have personally briefly reviewed patient's old medical records in [facility]   HPI: Brad Peterson is a 45 y.o. male with medical history significant of alcoholism and respiratory failure secondary to COVID infection in 2021 who presents with increasing confusion and tremor from [facility].  His wife is present at bedside and gives additional history as the patient is unable to do so due to confusion.  She states that after he was released from the hospital after having COVID 2001 he was never the same.  His wife did not recognize how much the patient was drinking until about 2 months ago.  To her knowledge she had been drinking vodka and beer, but severe upset stomach.  To her knowledge she was drinking at least 1/5 of vodka daily.  His wife had been trying to water down of vodka to decrease his consumption without much success.  He had been having falls, but last home possibly was from a month ago at home.  She had taken him to [facility] to start the detox program 2 days ago.  She is unsure if he was drinking that morning before going.  However since getting there patient had been noted to be more confused with increased tremor. Records note that they have been treating patient with oral Ativan, carbamazepine, and trazodone.  His wife also states that he has been hallucinating that the curtain in front of the room is a TV.  ED Course: Upon admission to the emergency department patient was seen he afebrile, pulse 85-105, and all other vital signs maintained.  Labs significant for platelets 84, and  magnesium 1.5.  Urinalysis was significant for hematuria, but did not note any signs of infection.  Patient had initially been given 100 mg of Librium and  Ativan 1 mg-> 2 mg->4 mg.  Critical care havd been consulted due to concern for need of Precedex drip.  After being evaluated patient was given phenobarbital IV and started on a taper with recommendations of continuing Ativan as needed ,and keeping n.p.o. until more alert  Review of Systems  Unable to perform ROS: Mental status change  Psychiatric/Behavioral:  Positive for hallucinations and substance abuse.    Past Medical History:  Diagnosis Date   Alcoholism [facility])     History reviewed. No pertinent surgical history.   reports that he has never smoked. He has never used smokeless tobacco. He reports current alcohol use. He reports that he does not use drugs.  Allergies  Allergen Reactions   Amoxicillin Rash    Family History  Problem Relation Age of Onset   Diabetes Mellitus II Neg Hx     Prior to Admission medications   Not on File    Physical Exam:  Constitutional: Middle-age male who is unable to stay still and fidgeting about the bed Vitals:   04/12/21 1014 04/12/21 1100 04/12/21 1145 04/12/21 1230  BP: (!) 144/102 (!) 133/102 (!) 135/104 (!) 141/113  Pulse:  (!) 105 85 86  Resp:  18 19 20   Temp:      TempSrc:      SpO2:  96% 97% 98%  Weight:      Height:       Eyes: PERRL, lids and conjunctivae normal ENMT: Mucous membranes are moist. Posterior  pharynx clear of any exudate or lesions.  Neck: normal, supple, no masses, no thyromegaly Respiratory: clear to auscultation bilaterally, no wheezing, no crackles. Normal respiratory effort. No accessory muscle use.  Cardiovascular: Regular rate and rhythm, no murmurs / rubs / gallops. No extremity edema. 2+ pedal pulses. No carotid bruits.  Abdomen: no tenderness, no masses palpated. No hepatosplenomegaly. Bowel sounds positive.  Musculoskeletal: no clubbing / cyanosis. No joint deformity upper and lower extremities. Good ROM, no contractures. Normal muscle tone.  Skin: no rashes, lesions, ulcers. No  induration Neurologic: CN 2-12 grossly intact.  Tremor present Psychiatric: Patient is confused and only oriented to self.  Restless.   Labs on Admission: I have personally reviewed following labs and imaging studies  CBC: Recent Labs  Lab 04/12/21 1100  WBC 4.0  NEUTROABS 2.2  HGB 13.4  HCT 38.6*  MCV 93.2  PLT 87*   Basic Metabolic Panel: Recent Labs  Lab 04/12/21 1100  NA 137  K 3.5  CL 98  CO2 27  GLUCOSE 99  BUN 9  CREATININE 0.78  CALCIUM 10.1  MG 1.5*   GFR: Estimated Creatinine Clearance: 135.6 mL/min (by C-G formula based on SCr of 0.78 mg/dL). Liver Function Tests: No results for input(s): AST, ALT, ALKPHOS, BILITOT, PROT, ALBUMIN in the last 168 hours. No results for input(s): LIPASE, AMYLASE in the last 168 hours. No results for input(s): AMMONIA in the last 168 hours. Coagulation Profile: No results for input(s): INR, PROTIME in the last 168 hours. Cardiac Enzymes: No results for input(s): CKTOTAL, CKMB, CKMBINDEX, TROPONINI in the last 168 hours. BNP (last 3 results) No results for input(s): PROBNP in the last 8760 hours. HbA1C: No results for input(s): HGBA1C in the last 72 hours. CBG: No results for input(s): GLUCAP in the last 168 hours. Lipid Profile: No results for input(s): CHOL, HDL, LDLCALC, TRIG, CHOLHDL, LDLDIRECT in the last 72 hours. Thyroid Function Tests: No results for input(s): TSH, T4TOTAL, FREET4, T3FREE, THYROIDAB in the last 72 hours. Anemia Panel: No results for input(s): VITAMINB12, FOLATE, FERRITIN, TIBC, IRON, RETICCTPCT in the last 72 hours. Urine analysis: No results found for: COLORURINE, APPEARANCEUR, LABSPEC, PHURINE, GLUCOSEU, HGBUR, BILIRUBINUR, KETONESUR, PROTEINUR, UROBILINOGEN, NITRITE, LEUKOCYTESUR Sepsis Labs: No results found for this or any previous visit (from the past 240 hour(s)).   Radiological Exams on Admission: No results found.  EKG: Independently reviewed.  Sinus rhythm at 99  bpm  Assessment/Plan  Alcohol withdrawals with delirium: Acute.  Patient presents with tremor and worsening confusion with reports hallucinations from [facility].  While there the patient had been on oral medicines in attempt to help with detox, but had worsening withdrawal symptoms and transferred to the hospital for further evaluation. -Admit to a progressive -Seizure precaution -N.p.o. advance once patient becomes more alert and oriented -CIWA protocols with Ativan IV as needed -Phenobarbital taper as prescribed by PCCM -Thiamine IV -Follow-up pending studies checked by PCCM -Continue to monitor electrolytes and replace as needed -TOC consulted -Appreciate PCCM consultative services, will follow-up for any further recommendation  Thrombocytopenia: Acute.  Platelet count 84 on admission.  Suspect likely related with liver disease from patient's alcohol abuse.  No reports of bleeding. -Continue to monitor  Hypomagnesemia: Acute.  Magnesium noted to be 1.5 on admission.  Patient has been given 2 g of magnesium sulfate IV. -Continue to monitor and replace as needed  Suspected elevated LFTs: Given patient's history of alcohol would suspect he would have elevated liver enzymes as they had previously  been elevated back in 02/2020.  No reports of nausea and vomiting at this time. -Follow-up LFTs  Hepatic steatosis/liver cirrhosis:  Noted on abdominal ultrasound obtained today. Acute hepatitis panel previously noted to be negative back in 02/2020.  Suspect secondary to the patient's history of abuse.  -Continue monitor   DVT prophylaxis: SCDs  Code Status: Full Family Communication: Wife updated at bedside Disposition Plan: To be determined Consults called: PCCM Admission status: Inpatient, requiring likely  Clydie Braun MD [facility]   If 7PM-7AM, please contact night-coverage   04/12/2021, 2:32 PM

## 2021-04-13 DIAGNOSIS — K7200 Acute and subacute hepatic failure without coma: Secondary | ICD-10-CM

## 2021-04-13 DIAGNOSIS — F10231 Alcohol dependence with withdrawal delirium: Secondary | ICD-10-CM | POA: Diagnosis not present

## 2021-04-13 LAB — COMPREHENSIVE METABOLIC PANEL
ALT: 101 U/L — ABNORMAL HIGH (ref 0–44)
AST: 129 U/L — ABNORMAL HIGH (ref 15–41)
Albumin: 3.7 g/dL (ref 3.5–5.0)
Alkaline Phosphatase: 58 U/L (ref 38–126)
Anion gap: 10 (ref 5–15)
BUN: 6 mg/dL (ref 6–20)
CO2: 26 mmol/L (ref 22–32)
Calcium: 9.3 mg/dL (ref 8.9–10.3)
Chloride: 103 mmol/L (ref 98–111)
Creatinine, Ser: 0.75 mg/dL (ref 0.61–1.24)
GFR, Estimated: >60 mL/min (ref >60)
Glucose, Bld: 94 mg/dL (ref 70–99)
Potassium: 3 mmol/L — ABNORMAL LOW (ref 3.5–5.1)
Sodium: 139 mmol/L (ref 135–145)
Total Bilirubin: 1.6 mg/dL — ABNORMAL HIGH (ref 0.3–1.2)
Total Protein: 6.3 g/dL — ABNORMAL LOW (ref 6.5–8.1)

## 2021-04-13 LAB — CBC
HCT: 38.6 % — ABNORMAL LOW (ref 39.0–52.0)
Hemoglobin: 13.5 g/dL (ref 13.0–17.0)
MCH: 32.3 pg (ref 26.0–34.0)
MCHC: 35 g/dL (ref 30.0–36.0)
MCV: 92.3 fL (ref 80.0–100.0)
Platelets: 86 10*3/uL — ABNORMAL LOW (ref 150–400)
RBC: 4.18 MIL/uL — ABNORMAL LOW (ref 4.22–5.81)
RDW: 12.7 % (ref 11.5–15.5)
WBC: 6.7 10*3/uL (ref 4.0–10.5)
nRBC: 0 % (ref 0.0–0.2)

## 2021-04-13 LAB — PHOSPHORUS: Phosphorus: 4.7 mg/dL — ABNORMAL HIGH (ref 2.5–4.6)

## 2021-04-13 LAB — MAGNESIUM: Magnesium: 1.7 mg/dL (ref 1.7–2.4)

## 2021-04-13 MED ORDER — LACTULOSE ENEMA
300.0000 mL | Freq: Every day | ORAL | Status: DC
  Filled 2021-04-13: qty 300

## 2021-04-13 MED ORDER — LACTATED RINGERS IV SOLN: INTRAVENOUS | Status: DC

## 2021-04-13 MED ORDER — MAGNESIUM SULFATE 2 GM/50ML IV SOLN
2.0000 g | Freq: Once | INTRAVENOUS | Status: AC
  Administered 2021-04-13: 2 g via INTRAVENOUS
  Filled 2021-04-13: qty 50

## 2021-04-13 MED ORDER — POTASSIUM CHLORIDE 10 MEQ/100ML IV SOLN
10.0000 meq | INTRAVENOUS | Status: AC
  Administered 2021-04-13: 10 meq via INTRAVENOUS
  Filled 2021-04-13 (×2): qty 100

## 2021-04-13 MED ORDER — POTASSIUM CHLORIDE 10 MEQ/100ML IV SOLN
10.0000 meq | INTRAVENOUS | Status: AC
Start: 2021-04-13 — End: 2021-04-13
  Administered 2021-04-13 (×4): 10 meq via INTRAVENOUS
  Filled 2021-04-13 (×4): qty 100

## 2021-04-13 MED ORDER — LACTULOSE 10 GM/15ML PO SOLN
20.0000 g | Freq: Two times a day (BID) | ORAL | Status: DC
  Administered 2021-04-13 – 2021-04-14 (×3): 20 g via ORAL
  Filled 2021-04-13 (×3): qty 30

## 2021-04-13 NOTE — Progress Notes (Signed)
PROGRESS NOTE    SHER HELLINGER  GGE:[id number] DOB: [birth year] DOA: 04/12/2021 PCP: Clementeen Graham, PA-C   Brief Narrative: Taken from H&P Brad Peterson is a 45 y.o. male with medical history significant of alcoholism and respiratory failure secondary to COVID infection in 2021 who presents with increasing confusion and tremor from [facility].  Per wife he never returned to his baseline since his admission after having COVID in 2021.  He continued to drink a lot and recently admitted at [facility] to start the detox program 2 days ago.  Patient developed worsening confusion and increased tremor so he was sent to ER.  Patient was being treated with oral Ativan, carbamazepine taper and trazodone.  On arrival he was hemodynamically stable, labs pertinent for thrombocytopenia with platelets of 84, magnesium of 1.5, UA significant for hematuria with no urinary symptoms, ammonia elevated at 109, abnormal liver enzymes and ultrasound of liver with concern of liver cirrhosis. Patient received 100 mg of Librium and started on Ativan protocol for alcohol detox.  Patient was more alert and calm this morning.  Able to answer all the questions appropriately. Started on diet and lactulose-some concern of hepatic encephalopathy. Will need to get established with GI as an outpatient. PCCM was also consulted from ED as there was some concern of hallucinations.  Subjective: Patient was lying comfortably when seen today.  Stating that he was trying to get some sleep but easily arousable.  He was alert and oriented x3.  Answering all the questions appropriately, stating that he wants to get rid of this habit of drinking alcohol.  We discussed about liver cirrhosis and how it is important to quit drinking to decrease the progression.  He seems understanding.  Denies any hallucinations.  Tremor seems improving.  Assessment & Plan:   Principal Problem:   Alcohol withdrawal (HCC) Active  Problems:   Elevated LFTs   Hypomagnesemia   Thrombocytopenia (HCC)  Alcohol abuse with withdrawals.  CIWA score of 4 this morning.  Appears more reasonable and close to his baseline. -Continue with CIWA protocol and Ativan -Continue with phenobarbital taper as prescribed by PCCM -Continue with IV thiamine -Start him on diet  Liver cirrhosis/abnormal liver enzymes.  Liver ultrasound was obtained due to abnormal liver enzymes, suspected due to alcohol use.  Liver ultrasound with concern of liver cirrhosis.  Elevated levels of ammonia at 109.  Hepatic encephalopathy can also be playing a role in worsening of his confusion requiring current hospitalization.  Currently seems close to baseline. -Start him on lactulose-titrate for 2-3 soft bowel movements daily. -Patient will need outpatient GI referral for further management.  Thrombocytopenia.  Most likely Secondary to alcohol use.  No sign of bleeding and seems stable. -Continue to monitor  Hypokalemia/hypomagnesemia. -Replete electrolytes and monitor  Objective: Vitals:   04/13/21 0200 04/13/21 0414 04/13/21 0832 04/13/21 1111  BP:  (!) 130/96 128/90 (!) 139/96  Pulse: 84 95 85 (!) 102  Resp:  15 15 18   Temp:  98.4 F (36.9 C) 98.8 F (37.1 C) 98.9 F (37.2 C)  TempSrc:  Oral Oral Oral  SpO2:  96% 95% 97%  Weight:      Height:        Intake/Output Summary (Last 24 hours) at 04/13/2021 1456 Last data filed at 04/13/2021 1419 Gross per 24 hour  Intake 668.75 ml  Output 900 ml  Net -231.25 ml   Filed Weights   04/12/21 1008  Weight: 87.1 kg  Examination:  General exam: Appears calm and comfortable  Respiratory system: Clear to auscultation. Respiratory effort normal. Cardiovascular system: S1 & S2 heard, RRR.  Gastrointestinal system: Soft, nontender, nondistended, bowel sounds positive. Central nervous system: Alert and oriented. No focal neurological deficits. Extremities: No edema, no cyanosis, pulses intact and  symmetrical. Psychiatry: Judgement and insight appear normal.    DVT prophylaxis: SCDs, thrombocytopenia Code Status: Full Family Communication: Discussed with patient Disposition Plan:  Status is: Inpatient  Remains inpatient appropriate because:Inpatient level of care appropriate due to severity of illness  Dispo: The patient is from: Home              Anticipated d/c is to: Home              Patient currently is not medically stable to d/c.   Difficult to place patient No              Level of care: Progressive  All the records are reviewed and case discussed with Care Management/Social Worker. Management plans discussed with the patient, nursing and they are in agreement.  Consultants:  PCCM  Procedures:  Antimicrobials:   Data Reviewed: I have personally reviewed following labs and imaging studies  CBC: Recent Labs  Lab 04/12/21 1100 04/13/21 0144  WBC 4.0 6.7  NEUTROABS 2.2  --   HGB 13.4 13.5  HCT 38.6* 38.6*  MCV 93.2 92.3  PLT 87* 86*   Basic Metabolic Panel: Recent Labs  Lab 04/12/21 1100 04/13/21 0144  NA 137 139  K 3.5 3.0*  CL 98 103  CO2 27 26  GLUCOSE 99 94  BUN 9 6  CREATININE 0.78 0.75  CALCIUM 10.1 9.3  MG 1.5* 1.7  PHOS  --  4.7*   GFR: Estimated Creatinine Clearance: 135.6 mL/min (by C-G formula based on SCr of 0.75 mg/dL). Liver Function Tests: Recent Labs  Lab 04/12/21 1820 04/13/21 0144  AST 125* 129*  ALT 95* 101*  ALKPHOS 59 58  BILITOT 1.0 1.6*  PROT 6.3* 6.3*  ALBUMIN 3.8 3.7   No results for input(s): LIPASE, AMYLASE in the last 168 hours. Recent Labs  Lab 04/12/21 1820  AMMONIA 109*   Coagulation Profile: Recent Labs  Lab 04/12/21 1820  INR 1.0   Cardiac Enzymes: Recent Labs  Lab 04/12/21 1820  CKTOTAL 285   BNP (last 3 results) No results for input(s): PROBNP in the last 8760 hours. HbA1C: No results for input(s): HGBA1C in the last 72 hours. CBG: No results for input(s): GLUCAP in the last 168  hours. Lipid Profile: No results for input(s): CHOL, HDL, LDLCALC, TRIG, CHOLHDL, LDLDIRECT in the last 72 hours. Thyroid Function Tests: No results for input(s): TSH, T4TOTAL, FREET4, T3FREE, THYROIDAB in the last 72 hours. Anemia Panel: Recent Labs    04/12/21 1820  VITAMINB12 776  FOLATE 12.2  FERRITIN 4,228*  TIBC 228*  IRON 185*   Sepsis Labs: No results for input(s): PROCALCITON, LATICACIDVEN in the last 168 hours.  Recent Results (from the past 240 hour(s))  Resp Panel by RT-PCR (Flu A&B, Covid) Nasopharyngeal Swab     Status: None   Collection Time: 04/12/21  2:10 PM   Specimen: Nasopharyngeal Swab; Nasopharyngeal(NP) swabs in vial transport medium  Result Value Ref Range Status   SARS Coronavirus 2 by RT PCR NEGATIVE NEGATIVE Final    Comment: (NOTE) SARS-CoV-2 target nucleic acids are NOT DETECTED.  The SARS-CoV-2 RNA is generally detectable in upper respiratory specimens during the acute phase  of infection. The lowest concentration of SARS-CoV-2 viral copies this assay can detect is 138 copies/mL. A negative result does not preclude SARS-Cov-2 infection and should not be used as the sole basis for treatment or other patient management decisions. A negative result may occur with  improper specimen collection/handling, submission of specimen other than nasopharyngeal swab, presence of viral mutation(s) within the areas targeted by this assay, and inadequate number of viral copies(<138 copies/mL). A negative result must be combined with clinical observations, patient history, and epidemiological information. The expected result is Negative.  Fact Sheet for Patients:  BloggerCourse.com  Fact Sheet for Healthcare Providers:  SeriousBroker.it  This test is no t yet approved or cleared by the [place] FDA and  has been authorized for detection and/or diagnosis of SARS-CoV-2 by FDA under an Emergency Use  Authorization (EUA). This EUA will remain  in effect (meaning this test can be used) for the duration of the COVID-19 declaration under Section 564(b)(1) of the Act, 21 U.S.C.section 360bbb-3(b)(1), unless the authorization is terminated  or revoked sooner.       Influenza A by PCR NEGATIVE NEGATIVE Final   Influenza B by PCR NEGATIVE NEGATIVE Final    Comment: (NOTE) The Xpert Xpress SARS-CoV-2/FLU/RSV plus assay is intended as an aid in the diagnosis of influenza from Nasopharyngeal swab specimens and should not be used as a sole basis for treatment. Nasal washings and aspirates are unacceptable for Xpert Xpress SARS-CoV-2/FLU/RSV testing.  Fact Sheet for Patients: BloggerCourse.com  Fact Sheet for Healthcare Providers: SeriousBroker.it  This test is not yet approved or cleared by the [place] FDA and has been authorized for detection and/or diagnosis of SARS-CoV-2 by FDA under an Emergency Use Authorization (EUA). This EUA will remain in effect (meaning this test can be used) for the duration of the COVID-19 declaration under Section 564(b)(1) of the Act, 21 U.S.C. section 360bbb-3(b)(1), unless the authorization is terminated or revoked.  Performed at [facility], [address] [place], [place] [address]      Radiology Studies: US Abdomen Limited RUQ (LIVER/GB)  Result Date: 04/12/2021 CLINICAL DATA:  Cirrhosis. EXAM: ULTRASOUND ABDOMEN LIMITED RIGHT UPPER QUADRANT COMPARISON:  None. FINDINGS: Gallbladder: No gallstones or wall thickening visualized (2.3 mm). No sonographic Murphy sign noted by sonographer. Common bile duct: The common bile duct is not visualized. Liver: No focal lesion identified. The liver parenchyma is nodular in echotexture and diffusely increased in echogenicity. Portal vein is patent on color Doppler imaging with normal direction of blood flow towards the liver. Other: The study is limited  secondary to patient motion. IMPRESSION: 1. Findings suggestive of hepatic steatosis and liver cirrhosis. Electronically Signed   By: Aram Candela M.D.   On: 04/12/2021 17:07    Scheduled Meds:  lactulose  20 g Oral BID   LORazepam  0-4 mg Intravenous Q6H   Or   LORazepam  0-4 mg Oral Q6H   [START ON 04/14/2021] LORazepam  0-4 mg Intravenous Q12H   Or   [START ON 04/14/2021] LORazepam  0-4 mg Oral Q12H   PHENObarbital  130 mg Intravenous TID   Followed by   Melene Muller ON 04/14/2021] PHENObarbital  65 mg Intravenous TID   Followed by   Melene Muller ON 04/17/2021] PHENObarbital  65 mg Intravenous Q0600   sodium chloride flush  3 mL Intravenous Q12H   [START ON 04/14/2021] thiamine injection  100 mg Intravenous Q24H   Continuous Infusions:  lactated ringers 100 mL/hr at 04/13/21 [phone]  potassium chloride 10 mEq (04/13/21 1355)   thiamine injection 500 mg (04/13/21 0540)     LOS: 1 day   Time spent: 45 minutes. More than 50% of the time was spent in counseling/coordination of care  Arnetha Courser, MD [facility]  If 7PM-7AM, please contact night-coverage [email or web address]  04/13/2021, 2:56 PM   This record has been created using Conservation officer, historic buildings. Errors have been sought and corrected,but may not always be located. Such creation errors do not reflect on the standard of care.

## 2021-04-13 NOTE — Progress Notes (Signed)
Patient arrived to the floor via the stretcher, with his wife. Pt alert and oriented to self, place and date. No tremors noted. Bed weight obtained, cardiac monitoring and Seizure precaution initiated. CHG bath given, we'll continue to monitor.

## 2021-04-13 NOTE — Progress Notes (Signed)
NAME:  Brad Peterson, MRN:  [id number], DOB:  01-14-1976, LOS: 1 ADMISSION DATE:  04/12/2021, CONSULTATION DATE:  04/12/21 REFERRING MD:  EDP, CHIEF COMPLAINT:  tremors   History of Present Illness:  45 year old man with hx of heavy alcohol abuse, prior COVID infection presenting with increased tremors and confusion from rehab.  Received ativan x 7 mg and librium 100mg  x 1.  PCCM asked to consult given worsening w/d symptoms.  Patient currently giggling and passing gas.  Pertinent  Medical History  Alcohol abuse  Significant Hospital Events: Including procedures, antibiotic start and stop dates in addition to other pertinent events     Interim History / Subjective:  Received 9 mg of Ativan last 24 hours, 6 mg overnight. Afebrile On room air  Objective   Blood pressure 128/90, pulse 85, temperature 98.4 F (36.9 C), temperature source Oral, resp. rate 15, height 6\' 2"  (1.88 m), weight 87.1 kg, SpO2 95 %.        Intake/Output Summary (Last 24 hours) at 04/13/2021 0911 Last data filed at 04/13/2021 0300 Gross per 24 hour  Intake 1592.55 ml  Output --  Net 1592.55 ml    Filed Weights   04/12/21 1008  Weight: 87.1 kg    Examination: General: Lying supine in bed, restrained, no distress HENT: MMM, trachea midline Lungs: No accessory muscle use clear to auscultation Cardiovascular: RRR, ext warm Abdomen: Soft, +BS Extremities: no edema or stigmata of arthritis Neuro: Oriented to time, name, not to place, nonfocal, tremors Skin: no rashes of jaundice  Labs show hypokalemia, no leukocytosis INR okay Ammonia slight high, slight elevation LFTs, iron studies okay ferritin high  Resolved Hospital Problem list   N/a  Assessment & Plan:  Acute metabolic encephalopathy in setting of alcohol abstinence in heavy drinker- 1 fifth liquor per day New macrocytic pancytopenia question alcohol effect Hypomagnesemia, hypokalemia- being repleted Liver lesion noted 2008 thought to  be hemangioma  - Complete phenobarb taper with PRN ativan on top, goal RASS -1 and CIWA < 5  - Seizure precautions -Can resume p.o.  Cirrhosis -noted on right upper quadrant ultrasound, may need to establish with GI eventually Acute hepatic encephalopathy-lactulose can be switched to oral  PCCM will be available as needed    Labs   CBC: Recent Labs  Lab 04/12/21 1100 04/13/21 0144  WBC 4.0 6.7  NEUTROABS 2.2  --   HGB 13.4 13.5  HCT 38.6* 38.6*  MCV 93.2 92.3  PLT 87* 86*     Basic Metabolic Panel: Recent Labs  Lab 04/12/21 1100 04/13/21 0144  NA 137 139  K 3.5 3.0*  CL 98 103  CO2 27 26  GLUCOSE 99 94  BUN 9 6  CREATININE 0.78 0.75  CALCIUM 10.1 9.3  MG 1.5* 1.7  PHOS  --  4.7*    GFR: Estimated Creatinine Clearance: 135.6 mL/min (by C-G formula based on SCr of 0.75 mg/dL). Recent Labs  Lab 04/12/21 1100 04/13/21 0144  WBC 4.0 6.7     Liver Function Tests: Recent Labs  Lab 04/12/21 1820 04/13/21 0144  AST 125* 129*  ALT 95* 101*  ALKPHOS 59 58  BILITOT 1.0 1.6*  PROT 6.3* 6.3*  ALBUMIN 3.8 3.7   No results for input(s): LIPASE, AMYLASE in the last 168 hours. Recent Labs  Lab 04/12/21 1820  AMMONIA 109*     04/15/21 MD. FCCP. [facility] Pager : [phone]  If no response to pager ,  please call [phone] until 7 pm After 7:00 pm call Elink  [phone]   04/13/2021

## 2021-04-13 NOTE — Plan of Care (Signed)
POC initiated and progressing. 

## 2021-04-14 DIAGNOSIS — K7460 Unspecified cirrhosis of liver: Secondary | ICD-10-CM

## 2021-04-14 DIAGNOSIS — K7030 Alcoholic cirrhosis of liver without ascites: Secondary | ICD-10-CM

## 2021-04-14 LAB — COMPREHENSIVE METABOLIC PANEL
ALT: 82 U/L — ABNORMAL HIGH (ref 0–44)
AST: 70 U/L — ABNORMAL HIGH (ref 15–41)
Albumin: 3.4 g/dL — ABNORMAL LOW (ref 3.5–5.0)
Alkaline Phosphatase: 57 U/L (ref 38–126)
Anion gap: 6 (ref 5–15)
BUN: <5 mg/dL — ABNORMAL LOW (ref 6–20)
CO2: 26 mmol/L (ref 22–32)
Calcium: 9 mg/dL (ref 8.9–10.3)
Chloride: 105 mmol/L (ref 98–111)
Creatinine, Ser: 0.78 mg/dL (ref 0.61–1.24)
GFR, Estimated: >60 mL/min (ref >60)
Glucose, Bld: 89 mg/dL (ref 70–99)
Potassium: 3.6 mmol/L (ref 3.5–5.1)
Sodium: 137 mmol/L (ref 135–145)
Total Bilirubin: 0.8 mg/dL (ref 0.3–1.2)
Total Protein: 5.9 g/dL — ABNORMAL LOW (ref 6.5–8.1)

## 2021-04-14 LAB — MAGNESIUM: Magnesium: 2 mg/dL (ref 1.7–2.4)

## 2021-04-14 MED ORDER — LACTULOSE 10 GM/15ML PO SOLN: 20.0000 g | Freq: Two times a day (BID) | ORAL | 0 refills | Status: AC

## 2021-04-14 NOTE — TOC Transition Note (Addendum)
Transition of Care [facility]) - CM/SW Discharge Note   Patient Details  Name: Brad Peterson MRN: [id number] Date of Birth: 26-Aug-1975  Transition of Care [facility]) CM/SW Contact:  Bess Kinds, RN Phone Number: [phone] 04/14/2021, 2:40 PM   Clinical Narrative:     H&P and DC summary faxed to [facility] at [phone]. Spoke with Elnita Maxwell at [facility] to verify receipt of faxed documents. [facility] to arrange transportation from hospital. Notified nursing of pending transportation.   Update: Notified by [facility] that transportation was at the main entrance. Nursing notified.   Final next level of care: Other (comment) ([facility]) Barriers to Discharge: No Barriers Identified   Patient Goals and CMS Choice        Discharge Placement                       Discharge Plan and Services                                     Social Determinants of Health (SDOH) Interventions     Readmission Risk Interventions No flowsheet data found.

## 2021-04-14 NOTE — Discharge Summary (Signed)
Physician Discharge Summary  [place]:[id number] DOB: 1976/02/05 DOA: 04/12/2021  PCP: Clementeen Graham, PA-C  Admit date: 04/12/2021 Discharge date: 04/14/2021  [facility] From: [facility]position: [facility]  Recommendations for Outpatient Follow-up:  Follow up with PCP in 1-2 weeks Please obtain BMP/CBC in one week Please follow up on the following pending results: None  Home Health: No Equipment/Devices: None Discharge Condition: Stable CODE STATUS: Full Diet recommendation:  Regular   Brief/Interim Summary: [place] is a 45 y.o. male with medical history significant of alcoholism and respiratory failure secondary to COVID infection in 2021 who presents with increasing confusion and tremor from [facility].  Per wife he never returned to his baseline since his admission after having COVID in 2021.  He continued to drink a lot and recently admitted at [facility] to start the detox program 2 days ago.  Patient developed worsening confusion and increased tremor so he was sent to ER.  Patient was being treated with oral Ativan, carbamazepine taper and trazodone.   On arrival he was hemodynamically stable, labs pertinent for thrombocytopenia with platelets of 84, magnesium of 1.5, UA significant for hematuria with no urinary symptoms, ammonia elevated at 109, abnormal liver enzymes and ultrasound of liver with concern of liver cirrhosis. Patient received 100 mg of Librium and started on Ativan protocol for alcohol detox.  There was also some concern of hepatic encephalopathy as ammonia levels were elevated.  He was started on lactulose-should titrate the dose to have 2-3 soft bowel movements daily.  Patient is more alert and calm.  Continue to have minor upper extremity tremors.  No hallucination.  He was very appropriate and answer all the questions without any concern. He is stable enough to go back to [facility] to complete his  detox.  Because of his new diagnosis of hepatic cirrhosis he needs to get established with an GI as an outpatient.  PCP should be able to follow-up and provide the referral.  PCCM was also consulted from ED because of the concern of hallucinations.  He never required ICU level care or Precedex.  He can continue his detox regimen which was established at [facility].  Patient had stable thrombocytopenia most likely secondary to alcohol use.  No sign of bleeding. Liver functions improving on discharge.  He also found to have some hypokalemia and hypomagnesemia and electrolytes were repleted before discharge.  He can continue with rest of his home medications and follow-up with his providers.  Discharge Diagnoses:  Principal Problem:   Alcohol withdrawal (HCC) Active Problems:   Elevated LFTs   Hypomagnesemia   Thrombocytopenia (HCC)   Cirrhosis (HCC)   Discharge Instructions  Discharge Instructions     Diet - low sodium heart healthy   Complete by: As directed    Discharge instructions   Complete by: As directed    It was pleasure taking care of you. Continue taking your tapering dose of Ativan and carbamazepine and follow-up closely at [facility] for further recommendations. Avoid using any alcoholic beverages. Follow-up closely with your PCP for further recommendations   Increase activity slowly   Complete by: As directed       Allergies as of 04/14/2021       Reactions   Amoxicillin Rash        Medication List     STOP taking these medications    diazepam 5 MG/ML injection Commonly known as: VALIUM       TAKE these  medications    carbamazepine 200 MG tablet Commonly known as: TEGRETOL Take 100-200 mg by mouth as directed. Take 1 tablet (200 mg) BID X 5 Days, THEN 1/2 tablet (100 mg) BID X 3 Days THEN 1/2 tablet (100 mg) X 3 Days   famotidine 20 MG tablet Commonly known as: PEPCID Take 20 mg by mouth daily.   lactulose 10 GM/15ML  solution Commonly known as: CHRONULAC Take 30 mLs (20 g total) by mouth 2 (two) times daily.   LORazepam 2 MG tablet Commonly known as: ATIVAN Take 0.5-2 mg by mouth as directed. Take 2 mg QID for ONE DAY, THEN Take 1.5 mg QID for ONE DAY, THEN Take 1 mg QID for ONE DAY, THEN Take 0.5 mg QID for ONE DAY   Multi Complete Caps Take 1 capsule by mouth daily. 30 Day Course   thiamine 100 MG tablet Take 100 mg by mouth daily. 30 Day Course   traZODone 50 MG tablet Commonly known as: DESYREL Take 50 mg by mouth at bedtime.        Follow-up Information     Scifres, Nicole Cella, [place]. Schedule an appointment as soon as possible for a visit in 1 week(s).   Specialty: Physician Assistant Contact information: [address] [place] [address] [phone]                Allergies  Allergen Reactions   Amoxicillin Rash    Consultations: PCCM  Procedures/Studies: US Abdomen Limited RUQ (LIVER/GB)  Result Date: 04/12/2021 CLINICAL DATA:  Cirrhosis. EXAM: ULTRASOUND ABDOMEN LIMITED RIGHT UPPER QUADRANT COMPARISON:  None. FINDINGS: Gallbladder: No gallstones or wall thickening visualized (2.3 mm). No sonographic Murphy sign noted by sonographer. Common bile duct: The common bile duct is not visualized. Liver: No focal lesion identified. The liver parenchyma is nodular in echotexture and diffusely increased in echogenicity. Portal vein is patent on color Doppler imaging with normal direction of blood flow towards the liver. Other: The study is limited secondary to patient motion. IMPRESSION: 1. Findings suggestive of hepatic steatosis and liver cirrhosis. Electronically Signed   By: Aram Candela M.D.   On: 04/12/2021 17:07    Subjective: Patient was seen and examined today.  He was feeling much improved.  Very mild hand tremors.  Denies any hallucination.  Ready to go back to [facility]. Parents at bedside, seems very supportive.  Discharge Exam: Vitals:    04/14/21 0323 04/14/21 1125  BP: (!) 132/91 (!) 141/99  Pulse: 81 89  Resp: 15 16  Temp: 98.2 F (36.8 C) 98.7 F (37.1 C)  SpO2: 96% 99%   Vitals:   04/13/21 2026 04/13/21 2324 04/14/21 0323 04/14/21 1125  BP: 123/87 124/86 (!) 132/91 (!) 141/99  Pulse:  93 81 89  Resp:  14 15 16   Temp:  98.8 F (37.1 C) 98.2 F (36.8 C) 98.7 F (37.1 C)  TempSrc:  Oral Oral Oral  SpO2:  95% 96% 99%  Weight:      Height:        General: Pt is alert, awake, not in acute distress Cardiovascular: RRR, S1/S2 +, no rubs, no gallops Respiratory: CTA bilaterally, no wheezing, no rhonchi Abdominal: Soft, NT, ND, bowel sounds + Extremities: no edema, no cyanosis   The results of significant diagnostics from this hospitalization (including imaging, microbiology, ancillary and laboratory) are listed below for reference.    Microbiology: Recent Results (from the past 240 hour(s))  Resp Panel by RT-PCR (Flu A&B, Covid)  Nasopharyngeal Swab     Status: None   Collection Time: 04/12/21  2:10 PM   Specimen: Nasopharyngeal Swab; Nasopharyngeal(NP) swabs in vial transport medium  Result Value Ref Range Status   SARS Coronavirus 2 by RT PCR NEGATIVE NEGATIVE Final    Comment: (NOTE) SARS-CoV-2 target nucleic acids are NOT DETECTED.  The SARS-CoV-2 RNA is generally detectable in upper respiratory specimens during the acute phase of infection. The lowest concentration of SARS-CoV-2 viral copies this assay can detect is 138 copies/mL. A negative result does not preclude SARS-Cov-2 infection and should not be used as the sole basis for treatment or other patient management decisions. A negative result may occur with  improper specimen collection/handling, submission of specimen other than nasopharyngeal swab, presence of viral mutation(s) within the areas targeted by this assay, and inadequate number of viral copies(<138 copies/mL). A negative result must be combined with clinical observations, patient  history, and epidemiological information. The expected result is Negative.  Fact Sheet for Patients:  BloggerCourse.com  Fact Sheet for Healthcare Providers:  SeriousBroker.it  This test is no t yet approved or cleared by the [place] FDA and  has been authorized for detection and/or diagnosis of SARS-CoV-2 by FDA under an Emergency Use Authorization (EUA). This EUA will remain  in effect (meaning this test can be used) for the duration of the COVID-19 declaration under Section 564(b)(1) of the Act, 21 U.S.C.section 360bbb-3(b)(1), unless the authorization is terminated  or revoked sooner.       Influenza A by PCR NEGATIVE NEGATIVE Final   Influenza B by PCR NEGATIVE NEGATIVE Final    Comment: (NOTE) The Xpert Xpress SARS-CoV-2/FLU/RSV plus assay is intended as an aid in the diagnosis of influenza from Nasopharyngeal swab specimens and should not be used as a sole basis for treatment. Nasal washings and aspirates are unacceptable for Xpert Xpress SARS-CoV-2/FLU/RSV testing.  Fact Sheet for Patients: BloggerCourse.com  Fact Sheet for Healthcare Providers: SeriousBroker.it  This test is not yet approved or cleared by the [place] FDA and has been authorized for detection and/or diagnosis of SARS-CoV-2 by FDA under an Emergency Use Authorization (EUA). This EUA will remain in effect (meaning this test can be used) for the duration of the COVID-19 declaration under Section 564(b)(1) of the Act, 21 U.S.C. section 360bbb-3(b)(1), unless the authorization is terminated or revoked.  Performed at [facility], [address], [place], [place] [address]      Labs: BNP (last 3 results) No results for input(s): BNP in the last 8760 hours. Basic Metabolic Panel: Recent Labs  Lab 04/12/21 1100 04/13/21 0144 04/14/21 1010  NA 137 139 137  K 3.5 3.0* 3.6  CL 98  103 105  CO2 27 26 26   GLUCOSE 99 94 89  BUN 9 6 <5*  CREATININE 0.78 0.75 0.78  CALCIUM 10.1 9.3 9.0  MG 1.5* 1.7 2.0  PHOS  --  4.7*  --    Liver Function Tests: Recent Labs  Lab 04/12/21 1820 04/13/21 0144 04/14/21 1010  AST 125* 129* 70*  ALT 95* 101* 82*  ALKPHOS 59 58 57  BILITOT 1.0 1.6* 0.8  PROT 6.3* 6.3* 5.9*  ALBUMIN 3.8 3.7 3.4*   No results for input(s): LIPASE, AMYLASE in the last 168 hours. Recent Labs  Lab 04/12/21 1820  AMMONIA 109*   CBC: Recent Labs  Lab 04/12/21 1100 04/13/21 0144  WBC 4.0 6.7  NEUTROABS 2.2  --   HGB 13.4 13.5  HCT 38.6*  38.6*  MCV 93.2 92.3  PLT 87* 86*   Cardiac Enzymes: Recent Labs  Lab 04/12/21 1820  CKTOTAL 285   BNP: Invalid input(s): POCBNP CBG: No results for input(s): GLUCAP in the last 168 hours. D-Dimer No results for input(s): DDIMER in the last 72 hours. Hgb A1c No results for input(s): HGBA1C in the last 72 hours. Lipid Profile No results for input(s): CHOL, HDL, LDLCALC, TRIG, CHOLHDL, LDLDIRECT in the last 72 hours. Thyroid function studies No results for input(s): TSH, T4TOTAL, T3FREE, THYROIDAB in the last 72 hours.  Invalid input(s): FREET3 Anemia work up Recent Labs    04/12/21 1820  VITAMINB12 776  FOLATE 12.2  FERRITIN 4,228*  TIBC 228*  IRON 185*   Urinalysis    Component Value Date/Time   COLORURINE YELLOW 04/12/2021 1400   APPEARANCEUR CLEAR 04/12/2021 1400   LABSPEC 1.009 04/12/2021 1400   PHURINE 8.0 04/12/2021 1400   GLUCOSEU NEGATIVE 04/12/2021 1400   HGBUR MODERATE (A) 04/12/2021 1400   BILIRUBINUR NEGATIVE 04/12/2021 1400   KETONESUR NEGATIVE 04/12/2021 1400   PROTEINUR NEGATIVE 04/12/2021 1400   NITRITE NEGATIVE 04/12/2021 1400   LEUKOCYTESUR NEGATIVE 04/12/2021 1400   Sepsis Labs Invalid input(s): PROCALCITONIN,  WBC,  LACTICIDVEN Microbiology Recent Results (from the past 240 hour(s))  Resp Panel by RT-PCR (Flu A&B, Covid) Nasopharyngeal Swab     Status:  None   Collection Time: 04/12/21  2:10 PM   Specimen: Nasopharyngeal Swab; Nasopharyngeal(NP) swabs in vial transport medium  Result Value Ref Range Status   SARS Coronavirus 2 by RT PCR NEGATIVE NEGATIVE Final    Comment: (NOTE) SARS-CoV-2 target nucleic acids are NOT DETECTED.  The SARS-CoV-2 RNA is generally detectable in upper respiratory specimens during the acute phase of infection. The lowest concentration of SARS-CoV-2 viral copies this assay can detect is 138 copies/mL. A negative result does not preclude SARS-Cov-2 infection and should not be used as the sole basis for treatment or other patient management decisions. A negative result may occur with  improper specimen collection/handling, submission of specimen other than nasopharyngeal swab, presence of viral mutation(s) within the areas targeted by this assay, and inadequate number of viral copies(<138 copies/mL). A negative result must be combined with clinical observations, patient history, and epidemiological information. The expected result is Negative.  Fact Sheet for Patients:  BloggerCourse.com  Fact Sheet for Healthcare Providers:  SeriousBroker.it  This test is no t yet approved or cleared by the [place] FDA and  has been authorized for detection and/or diagnosis of SARS-CoV-2 by FDA under an Emergency Use Authorization (EUA). This EUA will remain  in effect (meaning this test can be used) for the duration of the COVID-19 declaration under Section 564(b)(1) of the Act, 21 U.S.C.section 360bbb-3(b)(1), unless the authorization is terminated  or revoked sooner.       Influenza A by PCR NEGATIVE NEGATIVE Final   Influenza B by PCR NEGATIVE NEGATIVE Final    Comment: (NOTE) The Xpert Xpress SARS-CoV-2/FLU/RSV plus assay is intended as an aid in the diagnosis of influenza from Nasopharyngeal swab specimens and should not be used as a sole basis for  treatment. Nasal washings and aspirates are unacceptable for Xpert Xpress SARS-CoV-2/FLU/RSV testing.  Fact Sheet for Patients: BloggerCourse.com  Fact Sheet for Healthcare Providers: SeriousBroker.it  This test is not yet approved or cleared by the [place] FDA and has been authorized for detection and/or diagnosis of SARS-CoV-2 by FDA under an Emergency Use Authorization (EUA). This EUA will remain in  effect (meaning this test can be used) for the duration of the COVID-19 declaration under Section 564(b)(1) of the Act, 21 U.S.C. section 360bbb-3(b)(1), unless the authorization is terminated or revoked.  Performed at [facility], [address] [place], [place] [address]     Time coordinating discharge: Over 30 minutes  SIGNED:  Arnetha Courser, MD  [facility] 04/14/2021, 11:53 AM  If 7PM-7AM, please contact night-coverage [email or web address]  This record has been created using Conservation officer, historic buildings. Errors have been sought and corrected,but may not always be located. Such creation errors do not reflect on the standard of care.

## 2021-04-14 NOTE — TOC Transition Note (Signed)
Transition of Care [facility]) - CM/SW Discharge Note   Patient Details  Name: [place] Peterson MRN: [id number] Date of Birth: 17-Oct-1975  [facility] of [facility]) CM/SW Contact:  Kermit Balo, RN Phone Number: 04/14/2021, 1:02 PM   Clinical Narrative:    Patient is returning to [facility] today. CM spoke to the RN at [facility] and she needs H&P and d/c summary faxed. They will provide his medications and transportation back to the facility. CM has updated Toniann Fail with CM.    Final next level of care: Other (comment) ([facility]) Barriers to Discharge: No Barriers Identified   Patient Goals and CMS Choice        Discharge Placement                       Discharge Plan and Services                                     Social Determinants of Health (SDOH) Interventions     Readmission Risk Interventions No flowsheet data found.

## 2021-04-14 NOTE — Progress Notes (Signed)
Discharged to home with family office visits in place teaching done  

## 2021-04-17 DIAGNOSIS — G4700 Insomnia, unspecified: Secondary | ICD-10-CM | POA: Diagnosis not present

## 2021-04-17 DIAGNOSIS — K259 Gastric ulcer, unspecified as acute or chronic, without hemorrhage or perforation: Secondary | ICD-10-CM | POA: Diagnosis not present

## 2021-04-17 DIAGNOSIS — F419 Anxiety disorder, unspecified: Secondary | ICD-10-CM | POA: Diagnosis not present

## 2021-04-17 DIAGNOSIS — K219 Gastro-esophageal reflux disease without esophagitis: Secondary | ICD-10-CM | POA: Diagnosis not present

## 2021-04-17 DIAGNOSIS — R634 Abnormal weight loss: Secondary | ICD-10-CM | POA: Diagnosis not present

## 2021-04-17 DIAGNOSIS — F329 Major depressive disorder, single episode, unspecified: Secondary | ICD-10-CM | POA: Diagnosis not present

## 2021-04-17 DIAGNOSIS — Z8616 Personal history of COVID-19: Secondary | ICD-10-CM | POA: Diagnosis not present

## 2021-04-17 DIAGNOSIS — R2681 Unsteadiness on feet: Secondary | ICD-10-CM | POA: Diagnosis not present

## 2021-04-17 DIAGNOSIS — K7460 Unspecified cirrhosis of liver: Secondary | ICD-10-CM | POA: Diagnosis not present

## 2021-04-17 DIAGNOSIS — G629 Polyneuropathy, unspecified: Secondary | ICD-10-CM | POA: Diagnosis not present

## 2021-04-17 DIAGNOSIS — R809 Proteinuria, unspecified: Secondary | ICD-10-CM | POA: Diagnosis not present

## 2021-04-17 DIAGNOSIS — F4310 Post-traumatic stress disorder, unspecified: Secondary | ICD-10-CM | POA: Diagnosis not present

## 2021-04-17 DIAGNOSIS — F1020 Alcohol dependence, uncomplicated: Secondary | ICD-10-CM | POA: Diagnosis not present

## 2021-04-18 DIAGNOSIS — F4310 Post-traumatic stress disorder, unspecified: Secondary | ICD-10-CM | POA: Diagnosis not present

## 2021-04-18 DIAGNOSIS — F1020 Alcohol dependence, uncomplicated: Secondary | ICD-10-CM | POA: Diagnosis not present

## 2021-04-18 DIAGNOSIS — K219 Gastro-esophageal reflux disease without esophagitis: Secondary | ICD-10-CM | POA: Diagnosis not present

## 2021-04-18 DIAGNOSIS — R2681 Unsteadiness on feet: Secondary | ICD-10-CM | POA: Diagnosis not present

## 2021-04-18 DIAGNOSIS — F419 Anxiety disorder, unspecified: Secondary | ICD-10-CM | POA: Diagnosis not present

## 2021-04-18 DIAGNOSIS — K259 Gastric ulcer, unspecified as acute or chronic, without hemorrhage or perforation: Secondary | ICD-10-CM | POA: Diagnosis not present

## 2021-04-18 DIAGNOSIS — F329 Major depressive disorder, single episode, unspecified: Secondary | ICD-10-CM | POA: Diagnosis not present

## 2021-04-18 DIAGNOSIS — G4700 Insomnia, unspecified: Secondary | ICD-10-CM | POA: Diagnosis not present

## 2021-04-18 DIAGNOSIS — G629 Polyneuropathy, unspecified: Secondary | ICD-10-CM | POA: Diagnosis not present

## 2021-04-18 DIAGNOSIS — R809 Proteinuria, unspecified: Secondary | ICD-10-CM | POA: Diagnosis not present

## 2021-04-18 DIAGNOSIS — R634 Abnormal weight loss: Secondary | ICD-10-CM | POA: Diagnosis not present

## 2021-04-18 DIAGNOSIS — Z8616 Personal history of COVID-19: Secondary | ICD-10-CM | POA: Diagnosis not present

## 2021-04-18 DIAGNOSIS — K7460 Unspecified cirrhosis of liver: Secondary | ICD-10-CM | POA: Diagnosis not present

## 2021-04-19 DIAGNOSIS — F329 Major depressive disorder, single episode, unspecified: Secondary | ICD-10-CM | POA: Diagnosis not present

## 2021-04-19 DIAGNOSIS — K259 Gastric ulcer, unspecified as acute or chronic, without hemorrhage or perforation: Secondary | ICD-10-CM | POA: Diagnosis not present

## 2021-04-19 DIAGNOSIS — Z8616 Personal history of COVID-19: Secondary | ICD-10-CM | POA: Diagnosis not present

## 2021-04-19 DIAGNOSIS — R634 Abnormal weight loss: Secondary | ICD-10-CM | POA: Diagnosis not present

## 2021-04-19 DIAGNOSIS — F1020 Alcohol dependence, uncomplicated: Secondary | ICD-10-CM | POA: Diagnosis not present

## 2021-04-19 DIAGNOSIS — K7460 Unspecified cirrhosis of liver: Secondary | ICD-10-CM | POA: Diagnosis not present

## 2021-04-19 DIAGNOSIS — K219 Gastro-esophageal reflux disease without esophagitis: Secondary | ICD-10-CM | POA: Diagnosis not present

## 2021-04-19 DIAGNOSIS — F419 Anxiety disorder, unspecified: Secondary | ICD-10-CM | POA: Diagnosis not present

## 2021-04-19 DIAGNOSIS — F4310 Post-traumatic stress disorder, unspecified: Secondary | ICD-10-CM | POA: Diagnosis not present

## 2021-04-19 DIAGNOSIS — R2681 Unsteadiness on feet: Secondary | ICD-10-CM | POA: Diagnosis not present

## 2021-04-19 DIAGNOSIS — G629 Polyneuropathy, unspecified: Secondary | ICD-10-CM | POA: Diagnosis not present

## 2021-04-19 DIAGNOSIS — G4700 Insomnia, unspecified: Secondary | ICD-10-CM | POA: Diagnosis not present

## 2021-04-19 DIAGNOSIS — R809 Proteinuria, unspecified: Secondary | ICD-10-CM | POA: Diagnosis not present

## 2021-04-20 DIAGNOSIS — F419 Anxiety disorder, unspecified: Secondary | ICD-10-CM | POA: Diagnosis not present

## 2021-04-20 DIAGNOSIS — G4700 Insomnia, unspecified: Secondary | ICD-10-CM | POA: Diagnosis not present

## 2021-04-20 DIAGNOSIS — K7460 Unspecified cirrhosis of liver: Secondary | ICD-10-CM | POA: Diagnosis not present

## 2021-04-20 DIAGNOSIS — F4310 Post-traumatic stress disorder, unspecified: Secondary | ICD-10-CM | POA: Diagnosis not present

## 2021-04-20 DIAGNOSIS — G629 Polyneuropathy, unspecified: Secondary | ICD-10-CM | POA: Diagnosis not present

## 2021-04-20 DIAGNOSIS — R634 Abnormal weight loss: Secondary | ICD-10-CM | POA: Diagnosis not present

## 2021-04-20 DIAGNOSIS — Z8616 Personal history of COVID-19: Secondary | ICD-10-CM | POA: Diagnosis not present

## 2021-04-20 DIAGNOSIS — F1020 Alcohol dependence, uncomplicated: Secondary | ICD-10-CM | POA: Diagnosis not present

## 2021-04-20 DIAGNOSIS — K259 Gastric ulcer, unspecified as acute or chronic, without hemorrhage or perforation: Secondary | ICD-10-CM | POA: Diagnosis not present

## 2021-04-20 DIAGNOSIS — F329 Major depressive disorder, single episode, unspecified: Secondary | ICD-10-CM | POA: Diagnosis not present

## 2021-04-20 DIAGNOSIS — R809 Proteinuria, unspecified: Secondary | ICD-10-CM | POA: Diagnosis not present

## 2021-04-20 DIAGNOSIS — R2681 Unsteadiness on feet: Secondary | ICD-10-CM | POA: Diagnosis not present

## 2021-04-20 DIAGNOSIS — K219 Gastro-esophageal reflux disease without esophagitis: Secondary | ICD-10-CM | POA: Diagnosis not present

## 2021-04-21 DIAGNOSIS — R634 Abnormal weight loss: Secondary | ICD-10-CM | POA: Diagnosis not present

## 2021-04-21 DIAGNOSIS — F4310 Post-traumatic stress disorder, unspecified: Secondary | ICD-10-CM | POA: Diagnosis not present

## 2021-04-21 DIAGNOSIS — K7460 Unspecified cirrhosis of liver: Secondary | ICD-10-CM | POA: Diagnosis not present

## 2021-04-21 DIAGNOSIS — R2681 Unsteadiness on feet: Secondary | ICD-10-CM | POA: Diagnosis not present

## 2021-04-21 DIAGNOSIS — G629 Polyneuropathy, unspecified: Secondary | ICD-10-CM | POA: Diagnosis not present

## 2021-04-21 DIAGNOSIS — K219 Gastro-esophageal reflux disease without esophagitis: Secondary | ICD-10-CM | POA: Diagnosis not present

## 2021-04-21 DIAGNOSIS — F419 Anxiety disorder, unspecified: Secondary | ICD-10-CM | POA: Diagnosis not present

## 2021-04-21 DIAGNOSIS — R809 Proteinuria, unspecified: Secondary | ICD-10-CM | POA: Diagnosis not present

## 2021-04-21 DIAGNOSIS — F1020 Alcohol dependence, uncomplicated: Secondary | ICD-10-CM | POA: Diagnosis not present

## 2021-04-21 DIAGNOSIS — G4700 Insomnia, unspecified: Secondary | ICD-10-CM | POA: Diagnosis not present

## 2021-04-21 DIAGNOSIS — F329 Major depressive disorder, single episode, unspecified: Secondary | ICD-10-CM | POA: Diagnosis not present

## 2021-04-21 DIAGNOSIS — Z8616 Personal history of COVID-19: Secondary | ICD-10-CM | POA: Diagnosis not present

## 2021-04-21 DIAGNOSIS — K259 Gastric ulcer, unspecified as acute or chronic, without hemorrhage or perforation: Secondary | ICD-10-CM | POA: Diagnosis not present

## 2021-04-22 DIAGNOSIS — G4700 Insomnia, unspecified: Secondary | ICD-10-CM | POA: Diagnosis not present

## 2021-04-22 DIAGNOSIS — F4310 Post-traumatic stress disorder, unspecified: Secondary | ICD-10-CM | POA: Diagnosis not present

## 2021-04-22 DIAGNOSIS — F329 Major depressive disorder, single episode, unspecified: Secondary | ICD-10-CM | POA: Diagnosis not present

## 2021-04-22 DIAGNOSIS — F419 Anxiety disorder, unspecified: Secondary | ICD-10-CM | POA: Diagnosis not present

## 2021-04-22 DIAGNOSIS — G629 Polyneuropathy, unspecified: Secondary | ICD-10-CM | POA: Diagnosis not present

## 2021-04-22 DIAGNOSIS — Z8616 Personal history of COVID-19: Secondary | ICD-10-CM | POA: Diagnosis not present

## 2021-04-22 DIAGNOSIS — R809 Proteinuria, unspecified: Secondary | ICD-10-CM | POA: Diagnosis not present

## 2021-04-22 DIAGNOSIS — K219 Gastro-esophageal reflux disease without esophagitis: Secondary | ICD-10-CM | POA: Diagnosis not present

## 2021-04-22 DIAGNOSIS — R634 Abnormal weight loss: Secondary | ICD-10-CM | POA: Diagnosis not present

## 2021-04-22 DIAGNOSIS — R2681 Unsteadiness on feet: Secondary | ICD-10-CM | POA: Diagnosis not present

## 2021-04-22 DIAGNOSIS — K7460 Unspecified cirrhosis of liver: Secondary | ICD-10-CM | POA: Diagnosis not present

## 2021-04-22 DIAGNOSIS — F1020 Alcohol dependence, uncomplicated: Secondary | ICD-10-CM | POA: Diagnosis not present

## 2021-04-22 DIAGNOSIS — K259 Gastric ulcer, unspecified as acute or chronic, without hemorrhage or perforation: Secondary | ICD-10-CM | POA: Diagnosis not present

## 2021-04-23 DIAGNOSIS — F419 Anxiety disorder, unspecified: Secondary | ICD-10-CM | POA: Diagnosis not present

## 2021-04-23 DIAGNOSIS — R809 Proteinuria, unspecified: Secondary | ICD-10-CM | POA: Diagnosis not present

## 2021-04-23 DIAGNOSIS — K7460 Unspecified cirrhosis of liver: Secondary | ICD-10-CM | POA: Diagnosis not present

## 2021-04-23 DIAGNOSIS — F4310 Post-traumatic stress disorder, unspecified: Secondary | ICD-10-CM | POA: Diagnosis not present

## 2021-04-23 DIAGNOSIS — R634 Abnormal weight loss: Secondary | ICD-10-CM | POA: Diagnosis not present

## 2021-04-23 DIAGNOSIS — K259 Gastric ulcer, unspecified as acute or chronic, without hemorrhage or perforation: Secondary | ICD-10-CM | POA: Diagnosis not present

## 2021-04-23 DIAGNOSIS — K219 Gastro-esophageal reflux disease without esophagitis: Secondary | ICD-10-CM | POA: Diagnosis not present

## 2021-04-23 DIAGNOSIS — R2681 Unsteadiness on feet: Secondary | ICD-10-CM | POA: Diagnosis not present

## 2021-04-23 DIAGNOSIS — G629 Polyneuropathy, unspecified: Secondary | ICD-10-CM | POA: Diagnosis not present

## 2021-04-23 DIAGNOSIS — F1020 Alcohol dependence, uncomplicated: Secondary | ICD-10-CM | POA: Diagnosis not present

## 2021-04-23 DIAGNOSIS — F329 Major depressive disorder, single episode, unspecified: Secondary | ICD-10-CM | POA: Diagnosis not present

## 2021-04-23 DIAGNOSIS — G4700 Insomnia, unspecified: Secondary | ICD-10-CM | POA: Diagnosis not present

## 2021-04-23 DIAGNOSIS — Z8616 Personal history of COVID-19: Secondary | ICD-10-CM | POA: Diagnosis not present

## 2021-04-24 DIAGNOSIS — G4700 Insomnia, unspecified: Secondary | ICD-10-CM | POA: Diagnosis not present

## 2021-04-24 DIAGNOSIS — F1020 Alcohol dependence, uncomplicated: Secondary | ICD-10-CM | POA: Diagnosis not present

## 2021-04-24 DIAGNOSIS — G629 Polyneuropathy, unspecified: Secondary | ICD-10-CM | POA: Diagnosis not present

## 2021-04-24 DIAGNOSIS — F329 Major depressive disorder, single episode, unspecified: Secondary | ICD-10-CM | POA: Diagnosis not present

## 2021-04-24 DIAGNOSIS — K219 Gastro-esophageal reflux disease without esophagitis: Secondary | ICD-10-CM | POA: Diagnosis not present

## 2021-04-24 DIAGNOSIS — F419 Anxiety disorder, unspecified: Secondary | ICD-10-CM | POA: Diagnosis not present

## 2021-04-24 DIAGNOSIS — F4310 Post-traumatic stress disorder, unspecified: Secondary | ICD-10-CM | POA: Diagnosis not present

## 2021-04-24 DIAGNOSIS — K7460 Unspecified cirrhosis of liver: Secondary | ICD-10-CM | POA: Diagnosis not present

## 2021-04-24 DIAGNOSIS — R2681 Unsteadiness on feet: Secondary | ICD-10-CM | POA: Diagnosis not present

## 2021-04-24 DIAGNOSIS — K259 Gastric ulcer, unspecified as acute or chronic, without hemorrhage or perforation: Secondary | ICD-10-CM | POA: Diagnosis not present

## 2021-04-24 DIAGNOSIS — R809 Proteinuria, unspecified: Secondary | ICD-10-CM | POA: Diagnosis not present

## 2021-04-24 DIAGNOSIS — R634 Abnormal weight loss: Secondary | ICD-10-CM | POA: Diagnosis not present

## 2021-04-24 DIAGNOSIS — Z8616 Personal history of COVID-19: Secondary | ICD-10-CM | POA: Diagnosis not present

## 2021-04-25 DIAGNOSIS — F1020 Alcohol dependence, uncomplicated: Secondary | ICD-10-CM | POA: Diagnosis not present

## 2021-04-25 DIAGNOSIS — G629 Polyneuropathy, unspecified: Secondary | ICD-10-CM | POA: Diagnosis not present

## 2021-04-25 DIAGNOSIS — K7460 Unspecified cirrhosis of liver: Secondary | ICD-10-CM | POA: Diagnosis not present

## 2021-04-25 DIAGNOSIS — Z8616 Personal history of COVID-19: Secondary | ICD-10-CM | POA: Diagnosis not present

## 2021-04-25 DIAGNOSIS — R809 Proteinuria, unspecified: Secondary | ICD-10-CM | POA: Diagnosis not present

## 2021-04-25 DIAGNOSIS — F329 Major depressive disorder, single episode, unspecified: Secondary | ICD-10-CM | POA: Diagnosis not present

## 2021-04-25 DIAGNOSIS — K219 Gastro-esophageal reflux disease without esophagitis: Secondary | ICD-10-CM | POA: Diagnosis not present

## 2021-04-25 DIAGNOSIS — F419 Anxiety disorder, unspecified: Secondary | ICD-10-CM | POA: Diagnosis not present

## 2021-04-25 DIAGNOSIS — R2681 Unsteadiness on feet: Secondary | ICD-10-CM | POA: Diagnosis not present

## 2021-04-25 DIAGNOSIS — F4310 Post-traumatic stress disorder, unspecified: Secondary | ICD-10-CM | POA: Diagnosis not present

## 2021-04-25 DIAGNOSIS — R634 Abnormal weight loss: Secondary | ICD-10-CM | POA: Diagnosis not present

## 2021-04-25 DIAGNOSIS — G4700 Insomnia, unspecified: Secondary | ICD-10-CM | POA: Diagnosis not present

## 2021-04-25 DIAGNOSIS — K259 Gastric ulcer, unspecified as acute or chronic, without hemorrhage or perforation: Secondary | ICD-10-CM | POA: Diagnosis not present

## 2021-04-26 DIAGNOSIS — F1020 Alcohol dependence, uncomplicated: Secondary | ICD-10-CM | POA: Diagnosis not present

## 2021-04-26 DIAGNOSIS — R634 Abnormal weight loss: Secondary | ICD-10-CM | POA: Diagnosis not present

## 2021-04-26 DIAGNOSIS — K259 Gastric ulcer, unspecified as acute or chronic, without hemorrhage or perforation: Secondary | ICD-10-CM | POA: Diagnosis not present

## 2021-04-26 DIAGNOSIS — G4700 Insomnia, unspecified: Secondary | ICD-10-CM | POA: Diagnosis not present

## 2021-04-26 DIAGNOSIS — F329 Major depressive disorder, single episode, unspecified: Secondary | ICD-10-CM | POA: Diagnosis not present

## 2021-04-26 DIAGNOSIS — F4310 Post-traumatic stress disorder, unspecified: Secondary | ICD-10-CM | POA: Diagnosis not present

## 2021-04-26 DIAGNOSIS — K219 Gastro-esophageal reflux disease without esophagitis: Secondary | ICD-10-CM | POA: Diagnosis not present

## 2021-04-26 DIAGNOSIS — R2681 Unsteadiness on feet: Secondary | ICD-10-CM | POA: Diagnosis not present

## 2021-04-26 DIAGNOSIS — F419 Anxiety disorder, unspecified: Secondary | ICD-10-CM | POA: Diagnosis not present

## 2021-04-26 DIAGNOSIS — Z8616 Personal history of COVID-19: Secondary | ICD-10-CM | POA: Diagnosis not present

## 2021-04-26 DIAGNOSIS — G629 Polyneuropathy, unspecified: Secondary | ICD-10-CM | POA: Diagnosis not present

## 2021-04-26 DIAGNOSIS — K7460 Unspecified cirrhosis of liver: Secondary | ICD-10-CM | POA: Diagnosis not present

## 2021-04-26 DIAGNOSIS — R809 Proteinuria, unspecified: Secondary | ICD-10-CM | POA: Diagnosis not present

## 2021-04-27 DIAGNOSIS — R2681 Unsteadiness on feet: Secondary | ICD-10-CM | POA: Diagnosis not present

## 2021-04-27 DIAGNOSIS — K219 Gastro-esophageal reflux disease without esophagitis: Secondary | ICD-10-CM | POA: Diagnosis not present

## 2021-04-27 DIAGNOSIS — K259 Gastric ulcer, unspecified as acute or chronic, without hemorrhage or perforation: Secondary | ICD-10-CM | POA: Diagnosis not present

## 2021-04-27 DIAGNOSIS — G4700 Insomnia, unspecified: Secondary | ICD-10-CM | POA: Diagnosis not present

## 2021-04-27 DIAGNOSIS — F4310 Post-traumatic stress disorder, unspecified: Secondary | ICD-10-CM | POA: Diagnosis not present

## 2021-04-27 DIAGNOSIS — F1020 Alcohol dependence, uncomplicated: Secondary | ICD-10-CM | POA: Diagnosis not present

## 2021-04-27 DIAGNOSIS — F419 Anxiety disorder, unspecified: Secondary | ICD-10-CM | POA: Diagnosis not present

## 2021-04-27 DIAGNOSIS — Z8616 Personal history of COVID-19: Secondary | ICD-10-CM | POA: Diagnosis not present

## 2021-04-27 DIAGNOSIS — F329 Major depressive disorder, single episode, unspecified: Secondary | ICD-10-CM | POA: Diagnosis not present

## 2021-04-27 DIAGNOSIS — G629 Polyneuropathy, unspecified: Secondary | ICD-10-CM | POA: Diagnosis not present

## 2021-04-27 DIAGNOSIS — R809 Proteinuria, unspecified: Secondary | ICD-10-CM | POA: Diagnosis not present

## 2021-04-27 DIAGNOSIS — K7460 Unspecified cirrhosis of liver: Secondary | ICD-10-CM | POA: Diagnosis not present

## 2021-04-27 DIAGNOSIS — R634 Abnormal weight loss: Secondary | ICD-10-CM | POA: Diagnosis not present

## 2021-04-28 DIAGNOSIS — G4700 Insomnia, unspecified: Secondary | ICD-10-CM | POA: Diagnosis not present

## 2021-04-28 DIAGNOSIS — F1020 Alcohol dependence, uncomplicated: Secondary | ICD-10-CM | POA: Diagnosis not present

## 2021-04-28 DIAGNOSIS — F4310 Post-traumatic stress disorder, unspecified: Secondary | ICD-10-CM | POA: Diagnosis not present

## 2021-04-28 DIAGNOSIS — K219 Gastro-esophageal reflux disease without esophagitis: Secondary | ICD-10-CM | POA: Diagnosis not present

## 2021-04-28 DIAGNOSIS — Z8616 Personal history of COVID-19: Secondary | ICD-10-CM | POA: Diagnosis not present

## 2021-04-28 DIAGNOSIS — F329 Major depressive disorder, single episode, unspecified: Secondary | ICD-10-CM | POA: Diagnosis not present

## 2021-04-28 DIAGNOSIS — R2681 Unsteadiness on feet: Secondary | ICD-10-CM | POA: Diagnosis not present

## 2021-04-28 DIAGNOSIS — K259 Gastric ulcer, unspecified as acute or chronic, without hemorrhage or perforation: Secondary | ICD-10-CM | POA: Diagnosis not present

## 2021-04-28 DIAGNOSIS — K7460 Unspecified cirrhosis of liver: Secondary | ICD-10-CM | POA: Diagnosis not present

## 2021-04-28 DIAGNOSIS — F419 Anxiety disorder, unspecified: Secondary | ICD-10-CM | POA: Diagnosis not present

## 2021-04-28 DIAGNOSIS — R634 Abnormal weight loss: Secondary | ICD-10-CM | POA: Diagnosis not present

## 2021-04-28 DIAGNOSIS — G629 Polyneuropathy, unspecified: Secondary | ICD-10-CM | POA: Diagnosis not present

## 2021-04-28 DIAGNOSIS — R809 Proteinuria, unspecified: Secondary | ICD-10-CM | POA: Diagnosis not present

## 2021-04-29 DIAGNOSIS — R634 Abnormal weight loss: Secondary | ICD-10-CM | POA: Diagnosis not present

## 2021-04-29 DIAGNOSIS — K259 Gastric ulcer, unspecified as acute or chronic, without hemorrhage or perforation: Secondary | ICD-10-CM | POA: Diagnosis not present

## 2021-04-29 DIAGNOSIS — K7460 Unspecified cirrhosis of liver: Secondary | ICD-10-CM | POA: Diagnosis not present

## 2021-04-29 DIAGNOSIS — G4700 Insomnia, unspecified: Secondary | ICD-10-CM | POA: Diagnosis not present

## 2021-04-29 DIAGNOSIS — R2681 Unsteadiness on feet: Secondary | ICD-10-CM | POA: Diagnosis not present

## 2021-04-29 DIAGNOSIS — R809 Proteinuria, unspecified: Secondary | ICD-10-CM | POA: Diagnosis not present

## 2021-04-29 DIAGNOSIS — F4310 Post-traumatic stress disorder, unspecified: Secondary | ICD-10-CM | POA: Diagnosis not present

## 2021-04-29 DIAGNOSIS — K219 Gastro-esophageal reflux disease without esophagitis: Secondary | ICD-10-CM | POA: Diagnosis not present

## 2021-04-29 DIAGNOSIS — F419 Anxiety disorder, unspecified: Secondary | ICD-10-CM | POA: Diagnosis not present

## 2021-04-29 DIAGNOSIS — G629 Polyneuropathy, unspecified: Secondary | ICD-10-CM | POA: Diagnosis not present

## 2021-04-29 DIAGNOSIS — F329 Major depressive disorder, single episode, unspecified: Secondary | ICD-10-CM | POA: Diagnosis not present

## 2021-04-29 DIAGNOSIS — F1020 Alcohol dependence, uncomplicated: Secondary | ICD-10-CM | POA: Diagnosis not present

## 2021-04-29 DIAGNOSIS — Z8616 Personal history of COVID-19: Secondary | ICD-10-CM | POA: Diagnosis not present

## 2021-04-30 DIAGNOSIS — R634 Abnormal weight loss: Secondary | ICD-10-CM | POA: Diagnosis not present

## 2021-04-30 DIAGNOSIS — G4700 Insomnia, unspecified: Secondary | ICD-10-CM | POA: Diagnosis not present

## 2021-04-30 DIAGNOSIS — K219 Gastro-esophageal reflux disease without esophagitis: Secondary | ICD-10-CM | POA: Diagnosis not present

## 2021-04-30 DIAGNOSIS — F329 Major depressive disorder, single episode, unspecified: Secondary | ICD-10-CM | POA: Diagnosis not present

## 2021-04-30 DIAGNOSIS — K259 Gastric ulcer, unspecified as acute or chronic, without hemorrhage or perforation: Secondary | ICD-10-CM | POA: Diagnosis not present

## 2021-04-30 DIAGNOSIS — R2681 Unsteadiness on feet: Secondary | ICD-10-CM | POA: Diagnosis not present

## 2021-04-30 DIAGNOSIS — F419 Anxiety disorder, unspecified: Secondary | ICD-10-CM | POA: Diagnosis not present

## 2021-04-30 DIAGNOSIS — K7460 Unspecified cirrhosis of liver: Secondary | ICD-10-CM | POA: Diagnosis not present

## 2021-04-30 DIAGNOSIS — Z8616 Personal history of COVID-19: Secondary | ICD-10-CM | POA: Diagnosis not present

## 2021-04-30 DIAGNOSIS — F1020 Alcohol dependence, uncomplicated: Secondary | ICD-10-CM | POA: Diagnosis not present

## 2021-04-30 DIAGNOSIS — R809 Proteinuria, unspecified: Secondary | ICD-10-CM | POA: Diagnosis not present

## 2021-04-30 DIAGNOSIS — G629 Polyneuropathy, unspecified: Secondary | ICD-10-CM | POA: Diagnosis not present

## 2021-04-30 DIAGNOSIS — F4310 Post-traumatic stress disorder, unspecified: Secondary | ICD-10-CM | POA: Diagnosis not present

## 2021-05-01 DIAGNOSIS — Z8616 Personal history of COVID-19: Secondary | ICD-10-CM | POA: Diagnosis not present

## 2021-05-01 DIAGNOSIS — F1020 Alcohol dependence, uncomplicated: Secondary | ICD-10-CM | POA: Diagnosis not present

## 2021-05-01 DIAGNOSIS — F4310 Post-traumatic stress disorder, unspecified: Secondary | ICD-10-CM | POA: Diagnosis not present

## 2021-05-01 DIAGNOSIS — G629 Polyneuropathy, unspecified: Secondary | ICD-10-CM | POA: Diagnosis not present

## 2021-05-01 DIAGNOSIS — R634 Abnormal weight loss: Secondary | ICD-10-CM | POA: Diagnosis not present

## 2021-05-01 DIAGNOSIS — G4700 Insomnia, unspecified: Secondary | ICD-10-CM | POA: Diagnosis not present

## 2021-05-01 DIAGNOSIS — R809 Proteinuria, unspecified: Secondary | ICD-10-CM | POA: Diagnosis not present

## 2021-05-01 DIAGNOSIS — F419 Anxiety disorder, unspecified: Secondary | ICD-10-CM | POA: Diagnosis not present

## 2021-05-01 DIAGNOSIS — K7460 Unspecified cirrhosis of liver: Secondary | ICD-10-CM | POA: Diagnosis not present

## 2021-05-01 DIAGNOSIS — R2681 Unsteadiness on feet: Secondary | ICD-10-CM | POA: Diagnosis not present

## 2021-05-01 DIAGNOSIS — K259 Gastric ulcer, unspecified as acute or chronic, without hemorrhage or perforation: Secondary | ICD-10-CM | POA: Diagnosis not present

## 2021-05-01 DIAGNOSIS — F329 Major depressive disorder, single episode, unspecified: Secondary | ICD-10-CM | POA: Diagnosis not present

## 2021-05-01 DIAGNOSIS — K219 Gastro-esophageal reflux disease without esophagitis: Secondary | ICD-10-CM | POA: Diagnosis not present

## 2021-05-02 DIAGNOSIS — Z8616 Personal history of COVID-19: Secondary | ICD-10-CM | POA: Diagnosis not present

## 2021-05-02 DIAGNOSIS — F329 Major depressive disorder, single episode, unspecified: Secondary | ICD-10-CM | POA: Diagnosis not present

## 2021-05-02 DIAGNOSIS — G4700 Insomnia, unspecified: Secondary | ICD-10-CM | POA: Diagnosis not present

## 2021-05-02 DIAGNOSIS — K259 Gastric ulcer, unspecified as acute or chronic, without hemorrhage or perforation: Secondary | ICD-10-CM | POA: Diagnosis not present

## 2021-05-02 DIAGNOSIS — G629 Polyneuropathy, unspecified: Secondary | ICD-10-CM | POA: Diagnosis not present

## 2021-05-02 DIAGNOSIS — R2681 Unsteadiness on feet: Secondary | ICD-10-CM | POA: Diagnosis not present

## 2021-05-02 DIAGNOSIS — F4310 Post-traumatic stress disorder, unspecified: Secondary | ICD-10-CM | POA: Diagnosis not present

## 2021-05-02 DIAGNOSIS — F1020 Alcohol dependence, uncomplicated: Secondary | ICD-10-CM | POA: Diagnosis not present

## 2021-05-02 DIAGNOSIS — R634 Abnormal weight loss: Secondary | ICD-10-CM | POA: Diagnosis not present

## 2021-05-02 DIAGNOSIS — K219 Gastro-esophageal reflux disease without esophagitis: Secondary | ICD-10-CM | POA: Diagnosis not present

## 2021-05-02 DIAGNOSIS — K7460 Unspecified cirrhosis of liver: Secondary | ICD-10-CM | POA: Diagnosis not present

## 2021-05-02 DIAGNOSIS — F419 Anxiety disorder, unspecified: Secondary | ICD-10-CM | POA: Diagnosis not present

## 2021-05-02 DIAGNOSIS — R809 Proteinuria, unspecified: Secondary | ICD-10-CM | POA: Diagnosis not present

## 2021-05-03 DIAGNOSIS — F419 Anxiety disorder, unspecified: Secondary | ICD-10-CM | POA: Diagnosis not present

## 2021-05-03 DIAGNOSIS — R2681 Unsteadiness on feet: Secondary | ICD-10-CM | POA: Diagnosis not present

## 2021-05-03 DIAGNOSIS — R634 Abnormal weight loss: Secondary | ICD-10-CM | POA: Diagnosis not present

## 2021-05-03 DIAGNOSIS — F329 Major depressive disorder, single episode, unspecified: Secondary | ICD-10-CM | POA: Diagnosis not present

## 2021-05-03 DIAGNOSIS — K7460 Unspecified cirrhosis of liver: Secondary | ICD-10-CM | POA: Diagnosis not present

## 2021-05-03 DIAGNOSIS — K219 Gastro-esophageal reflux disease without esophagitis: Secondary | ICD-10-CM | POA: Diagnosis not present

## 2021-05-03 DIAGNOSIS — F1020 Alcohol dependence, uncomplicated: Secondary | ICD-10-CM | POA: Diagnosis not present

## 2021-05-03 DIAGNOSIS — Z8616 Personal history of COVID-19: Secondary | ICD-10-CM | POA: Diagnosis not present

## 2021-05-03 DIAGNOSIS — K259 Gastric ulcer, unspecified as acute or chronic, without hemorrhage or perforation: Secondary | ICD-10-CM | POA: Diagnosis not present

## 2021-05-03 DIAGNOSIS — G4700 Insomnia, unspecified: Secondary | ICD-10-CM | POA: Diagnosis not present

## 2021-05-03 DIAGNOSIS — F4310 Post-traumatic stress disorder, unspecified: Secondary | ICD-10-CM | POA: Diagnosis not present

## 2021-05-03 DIAGNOSIS — G629 Polyneuropathy, unspecified: Secondary | ICD-10-CM | POA: Diagnosis not present

## 2021-05-03 DIAGNOSIS — R809 Proteinuria, unspecified: Secondary | ICD-10-CM | POA: Diagnosis not present

## 2021-05-04 DIAGNOSIS — K7460 Unspecified cirrhosis of liver: Secondary | ICD-10-CM | POA: Diagnosis not present

## 2021-05-04 DIAGNOSIS — G4700 Insomnia, unspecified: Secondary | ICD-10-CM | POA: Diagnosis not present

## 2021-05-04 DIAGNOSIS — F419 Anxiety disorder, unspecified: Secondary | ICD-10-CM | POA: Diagnosis not present

## 2021-05-04 DIAGNOSIS — Z8616 Personal history of COVID-19: Secondary | ICD-10-CM | POA: Diagnosis not present

## 2021-05-04 DIAGNOSIS — R809 Proteinuria, unspecified: Secondary | ICD-10-CM | POA: Diagnosis not present

## 2021-05-04 DIAGNOSIS — R2681 Unsteadiness on feet: Secondary | ICD-10-CM | POA: Diagnosis not present

## 2021-05-04 DIAGNOSIS — F329 Major depressive disorder, single episode, unspecified: Secondary | ICD-10-CM | POA: Diagnosis not present

## 2021-05-04 DIAGNOSIS — F1020 Alcohol dependence, uncomplicated: Secondary | ICD-10-CM | POA: Diagnosis not present

## 2021-05-04 DIAGNOSIS — G629 Polyneuropathy, unspecified: Secondary | ICD-10-CM | POA: Diagnosis not present

## 2021-05-04 DIAGNOSIS — K259 Gastric ulcer, unspecified as acute or chronic, without hemorrhage or perforation: Secondary | ICD-10-CM | POA: Diagnosis not present

## 2021-05-04 DIAGNOSIS — K219 Gastro-esophageal reflux disease without esophagitis: Secondary | ICD-10-CM | POA: Diagnosis not present

## 2021-05-04 DIAGNOSIS — R634 Abnormal weight loss: Secondary | ICD-10-CM | POA: Diagnosis not present

## 2021-05-04 DIAGNOSIS — F4310 Post-traumatic stress disorder, unspecified: Secondary | ICD-10-CM | POA: Diagnosis not present

## 2021-05-05 DIAGNOSIS — K219 Gastro-esophageal reflux disease without esophagitis: Secondary | ICD-10-CM | POA: Diagnosis not present

## 2021-05-05 DIAGNOSIS — F329 Major depressive disorder, single episode, unspecified: Secondary | ICD-10-CM | POA: Diagnosis not present

## 2021-05-05 DIAGNOSIS — G629 Polyneuropathy, unspecified: Secondary | ICD-10-CM | POA: Diagnosis not present

## 2021-05-05 DIAGNOSIS — R809 Proteinuria, unspecified: Secondary | ICD-10-CM | POA: Diagnosis not present

## 2021-05-05 DIAGNOSIS — F4310 Post-traumatic stress disorder, unspecified: Secondary | ICD-10-CM | POA: Diagnosis not present

## 2021-05-05 DIAGNOSIS — K259 Gastric ulcer, unspecified as acute or chronic, without hemorrhage or perforation: Secondary | ICD-10-CM | POA: Diagnosis not present

## 2021-05-05 DIAGNOSIS — K7460 Unspecified cirrhosis of liver: Secondary | ICD-10-CM | POA: Diagnosis not present

## 2021-05-05 DIAGNOSIS — R634 Abnormal weight loss: Secondary | ICD-10-CM | POA: Diagnosis not present

## 2021-05-05 DIAGNOSIS — F419 Anxiety disorder, unspecified: Secondary | ICD-10-CM | POA: Diagnosis not present

## 2021-05-05 DIAGNOSIS — Z8616 Personal history of COVID-19: Secondary | ICD-10-CM | POA: Diagnosis not present

## 2021-05-05 DIAGNOSIS — R2681 Unsteadiness on feet: Secondary | ICD-10-CM | POA: Diagnosis not present

## 2021-05-05 DIAGNOSIS — G4700 Insomnia, unspecified: Secondary | ICD-10-CM | POA: Diagnosis not present

## 2021-05-05 DIAGNOSIS — F1020 Alcohol dependence, uncomplicated: Secondary | ICD-10-CM | POA: Diagnosis not present

## 2021-05-06 DIAGNOSIS — R809 Proteinuria, unspecified: Secondary | ICD-10-CM | POA: Diagnosis not present

## 2021-05-06 DIAGNOSIS — K219 Gastro-esophageal reflux disease without esophagitis: Secondary | ICD-10-CM | POA: Diagnosis not present

## 2021-05-06 DIAGNOSIS — R634 Abnormal weight loss: Secondary | ICD-10-CM | POA: Diagnosis not present

## 2021-05-06 DIAGNOSIS — Z8616 Personal history of COVID-19: Secondary | ICD-10-CM | POA: Diagnosis not present

## 2021-05-06 DIAGNOSIS — K7460 Unspecified cirrhosis of liver: Secondary | ICD-10-CM | POA: Diagnosis not present

## 2021-05-06 DIAGNOSIS — K259 Gastric ulcer, unspecified as acute or chronic, without hemorrhage or perforation: Secondary | ICD-10-CM | POA: Diagnosis not present

## 2021-05-06 DIAGNOSIS — G4700 Insomnia, unspecified: Secondary | ICD-10-CM | POA: Diagnosis not present

## 2021-05-06 DIAGNOSIS — F329 Major depressive disorder, single episode, unspecified: Secondary | ICD-10-CM | POA: Diagnosis not present

## 2021-05-06 DIAGNOSIS — F1020 Alcohol dependence, uncomplicated: Secondary | ICD-10-CM | POA: Diagnosis not present

## 2021-05-06 DIAGNOSIS — F419 Anxiety disorder, unspecified: Secondary | ICD-10-CM | POA: Diagnosis not present

## 2021-05-06 DIAGNOSIS — R2681 Unsteadiness on feet: Secondary | ICD-10-CM | POA: Diagnosis not present

## 2021-05-06 DIAGNOSIS — G629 Polyneuropathy, unspecified: Secondary | ICD-10-CM | POA: Diagnosis not present

## 2021-05-06 DIAGNOSIS — F4310 Post-traumatic stress disorder, unspecified: Secondary | ICD-10-CM | POA: Diagnosis not present

## 2021-05-07 DIAGNOSIS — R2681 Unsteadiness on feet: Secondary | ICD-10-CM | POA: Diagnosis not present

## 2021-05-07 DIAGNOSIS — K259 Gastric ulcer, unspecified as acute or chronic, without hemorrhage or perforation: Secondary | ICD-10-CM | POA: Diagnosis not present

## 2021-05-07 DIAGNOSIS — F329 Major depressive disorder, single episode, unspecified: Secondary | ICD-10-CM | POA: Diagnosis not present

## 2021-05-07 DIAGNOSIS — R809 Proteinuria, unspecified: Secondary | ICD-10-CM | POA: Diagnosis not present

## 2021-05-07 DIAGNOSIS — R634 Abnormal weight loss: Secondary | ICD-10-CM | POA: Diagnosis not present

## 2021-05-07 DIAGNOSIS — F1020 Alcohol dependence, uncomplicated: Secondary | ICD-10-CM | POA: Diagnosis not present

## 2021-05-07 DIAGNOSIS — K219 Gastro-esophageal reflux disease without esophagitis: Secondary | ICD-10-CM | POA: Diagnosis not present

## 2021-05-07 DIAGNOSIS — Z8616 Personal history of COVID-19: Secondary | ICD-10-CM | POA: Diagnosis not present

## 2021-05-07 DIAGNOSIS — G629 Polyneuropathy, unspecified: Secondary | ICD-10-CM | POA: Diagnosis not present

## 2021-05-07 DIAGNOSIS — F419 Anxiety disorder, unspecified: Secondary | ICD-10-CM | POA: Diagnosis not present

## 2021-05-07 DIAGNOSIS — F4310 Post-traumatic stress disorder, unspecified: Secondary | ICD-10-CM | POA: Diagnosis not present

## 2021-05-07 DIAGNOSIS — G4700 Insomnia, unspecified: Secondary | ICD-10-CM | POA: Diagnosis not present

## 2021-05-07 DIAGNOSIS — K7460 Unspecified cirrhosis of liver: Secondary | ICD-10-CM | POA: Diagnosis not present

## 2021-06-04 DIAGNOSIS — R251 Tremor, unspecified: Secondary | ICD-10-CM | POA: Diagnosis not present

## 2021-06-04 DIAGNOSIS — D696 Thrombocytopenia, unspecified: Secondary | ICD-10-CM | POA: Diagnosis not present

## 2021-06-04 DIAGNOSIS — G4700 Insomnia, unspecified: Secondary | ICD-10-CM | POA: Diagnosis not present

## 2021-06-04 DIAGNOSIS — R945 Abnormal results of liver function studies: Secondary | ICD-10-CM | POA: Diagnosis not present

## 2021-06-04 DIAGNOSIS — F1021 Alcohol dependence, in remission: Secondary | ICD-10-CM | POA: Diagnosis not present

## 2021-07-26 DIAGNOSIS — F1021 Alcohol dependence, in remission: Secondary | ICD-10-CM | POA: Diagnosis not present

## 2021-07-26 DIAGNOSIS — G4700 Insomnia, unspecified: Secondary | ICD-10-CM | POA: Diagnosis not present

## 2021-08-27 DIAGNOSIS — G4700 Insomnia, unspecified: Secondary | ICD-10-CM | POA: Diagnosis not present

## 2021-10-04 NOTE — Patient Outreach (Signed)
[facility]) Care Management ? ?10/04/2021 ? ?Brad Peterson ?1976-01-11 ?[id number] ? ? ?Received referral for of screening/assessments for Complex needs from Insurance plan. Assigned patient to [employer], LCSW for follow up. ? ?Philmore Pali ?[place] Management Assistant ?[phone] ? ?

## 2021-10-11 ENCOUNTER — Other Ambulatory Visit: Payer: Self-pay | Admitting: *Deleted

## 2021-10-11 ENCOUNTER — Encounter: Payer: Self-pay | Admitting: *Deleted

## 2021-10-11 NOTE — Patient Outreach (Signed)
Care Management ?Clinical Social Work Note ? ?10/11/2021 ?Name: [place] Peterson MRN: [id number] DOB: 1975/07/29 ? ?[place] is a 46 y.o. year old male who is a primary care patient of Scifres, Nicole Cella, [place].  The Care Management team was consulted for assistance with chronic disease management and coordination needs. ? ?Engaged with patient by telephone for initial visit in response to provider referral for social work chronic care management and care coordination services.  Patient denies the need for social work involvement at this time.  Patient did not wish to receive resources, or a referral for ETOH use/abuse, or mental health counseling and supportive services for psychoses. ? ?Consent to Services:  ?Mr. Vigeant was given information about Care Management services today including:  ?Care Management services includes personalized support from designated clinical staff supervised by his physician, including individualized plan of care and coordination with other care providers ?24/7 contact phone numbers for assistance for urgent and routine care needs. ?The patient may stop case management services at any time by phone call to the office staff. ? ?Patient agreed to services and consent obtained.  ? ?Assessment: Review of patient past medical history, allergies, medications, and health status, including review of relevant consultants reports was performed today as part of a comprehensive evaluation and provision of chronic care management and care coordination services. ? ?SDOH (Social Determinants of Health) assessments and interventions performed:  ?SDOH Interventions   ? ?Flowsheet Row Most Recent Value  ?SDOH Interventions   ?Food Insecurity Interventions Intervention Not Indicated  ?Financial Strain Interventions Intervention Not Indicated  ?Housing Interventions Intervention Not Indicated  ?Intimate Partner Violence Interventions Intervention Not Indicated  ?Physical Activity Interventions  Intervention Not Indicated  ?Stress Interventions Intervention Not Indicated  ?Social Connections Interventions Intervention Not Indicated  ?Transportation Interventions Intervention Not Indicated  ? ?  ?  ? ?Advanced Directives Status: Not ready or willing to discuss. ? ?Care Plan ? ?Allergies  ?Allergen Reactions  ? Amoxicillin Rash  ? ? ?Outpatient Encounter Medications as of 10/11/2021  ?Medication Sig  ? carbamazepine (TEGRETOL) 200 MG tablet Take 100-200 mg by mouth as directed. Take 1 tablet (200 mg) BID X 5 Days, THEN 1/2 tablet (100 mg) BID X 3 Days THEN 1/2 tablet (100 mg) X 3 Days  ? famotidine (PEPCID) 20 MG tablet Take 20 mg by mouth daily.  ? lactulose (CHRONULAC) 10 GM/15ML solution Take 30 mLs (20 g total) by mouth 2 (two) times daily.  ? LORazepam (ATIVAN) 2 MG tablet Take 0.5-2 mg by mouth as directed. Take 2 mg QID for ONE DAY, THEN Take 1.5 mg QID for ONE DAY, THEN Take 1 mg QID for ONE DAY, THEN Take 0.5 mg QID for ONE DAY  ? Multiple Vitamins-Minerals (MULTI COMPLETE) CAPS Take 1 capsule by mouth daily. 30 Day Course  ? thiamine 100 MG tablet Take 100 mg by mouth daily. 30 Day Course  ? traZODone (DESYREL) 50 MG tablet Take 50 mg by mouth at bedtime.  ? ?No facility-administered encounter medications on file as of 10/11/2021.  ? ? ?Patient Active Problem List  ? Diagnosis Date Noted  ? Cirrhosis (HCC)   ? Alcoholism (HCC) 04/12/2021  ? Alcohol withdrawal (HCC) 04/12/2021  ? Hypomagnesemia 04/12/2021  ? Thrombocytopenia (HCC) 04/12/2021  ? Acute respiratory failure due to COVID-19 [facility]) 03/06/2020  ? Elevated LFTs 03/06/2020  ? Acute respiratory disease due to COVID-19 virus 03/06/2020  ? ? ?Conditions to be addressed/monitored:  Cirrhosis, Alcoholism, Alcohol Withdrawal, and Acute Respiratory  Failure/Disease Due to COVID-19.  Mental Health Concerns, Substance Abuse Issues, and Lacks Knowledge of [facility]. ? ?Danford Bad, BSW, MSW, LCSW  ?Licensed Clinical Social Worker  ?[facility]  Customer service manager Care Management ?[facility]  ?Mailing Address-[address] [place], [place] [address] ?Physical Address-[address] [place], [place], [place] [address] ?Toll Free Main # [phone] ?Fax # [phone] ?Cell # [phone] ?[email or web address] ? ? ? ? ? ?  ?

## 2021-10-12 ENCOUNTER — Ambulatory Visit: Payer: Federal, State, Local not specified - PPO | Admitting: *Deleted

## 2021-12-31 IMAGING — US US ABDOMEN LIMITED
1 series · 14 of 25 positions shown · non-contrast
Comparison: None.

CLINICAL DATA: Cirrhosis.

EXAM:
ULTRASOUND ABDOMEN LIMITED RIGHT UPPER QUADRANT

[Series 1: us abdomen limited ruq (liver/gb) · 14 of 29 slices shown]
[im 1/29]
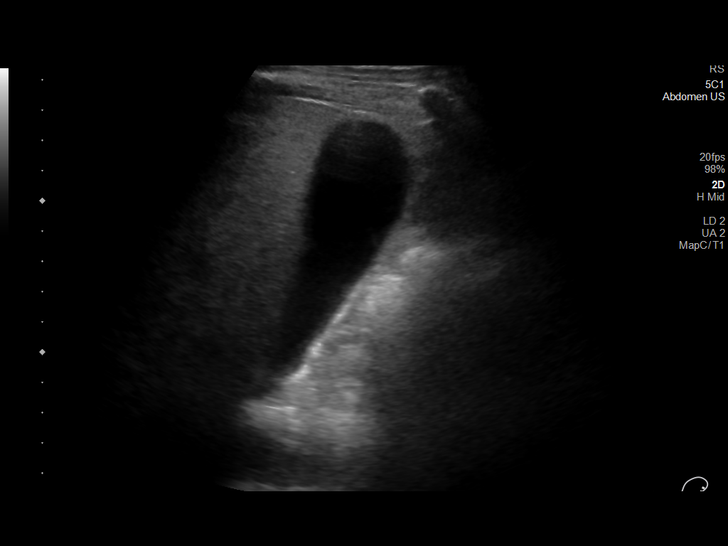
[im 3/29]
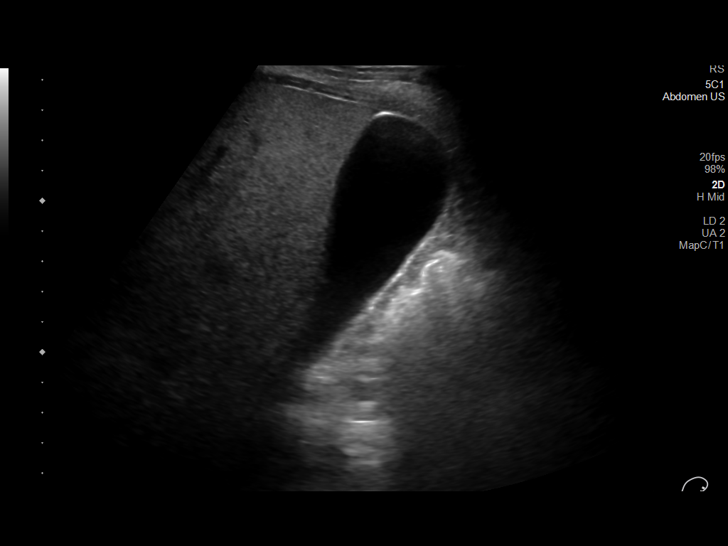
[im 5/29]
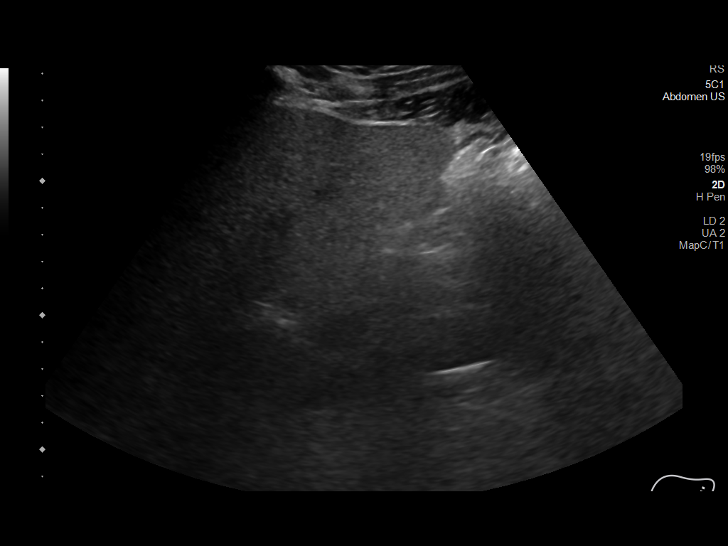
[im 8/29]
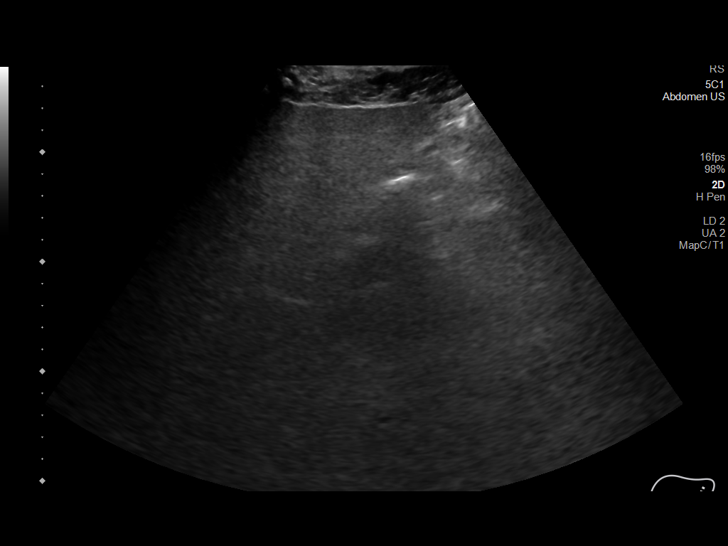
[im 10/29]
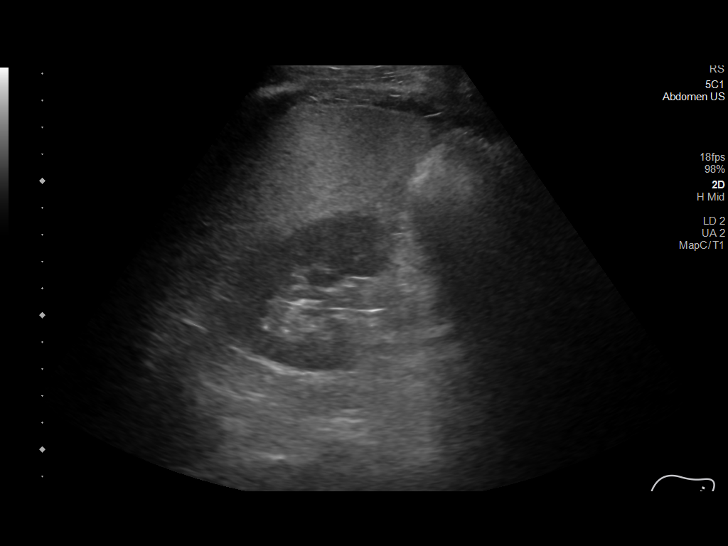
[im 11/29]
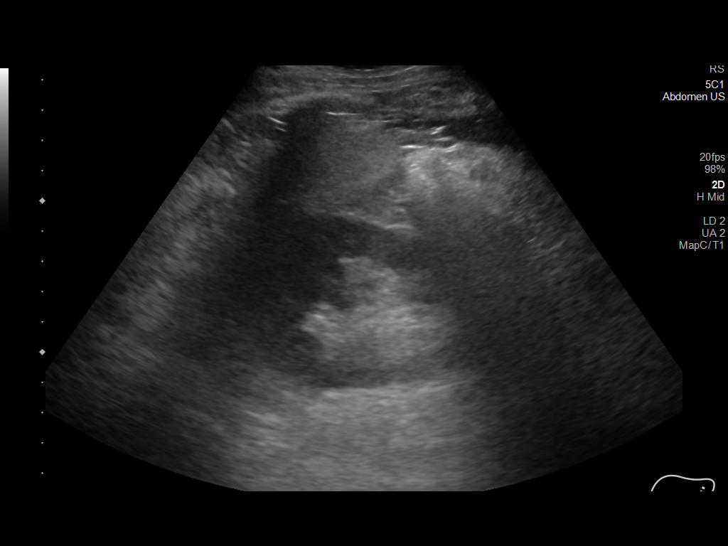
[im 13/29]
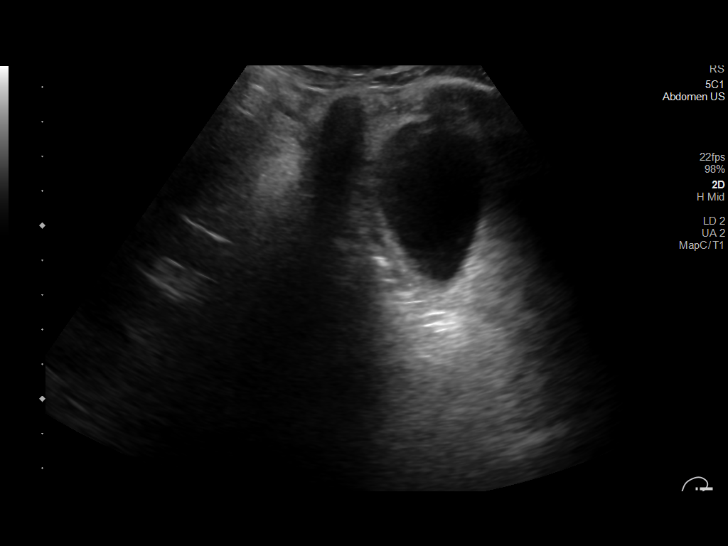
[im 16/29]
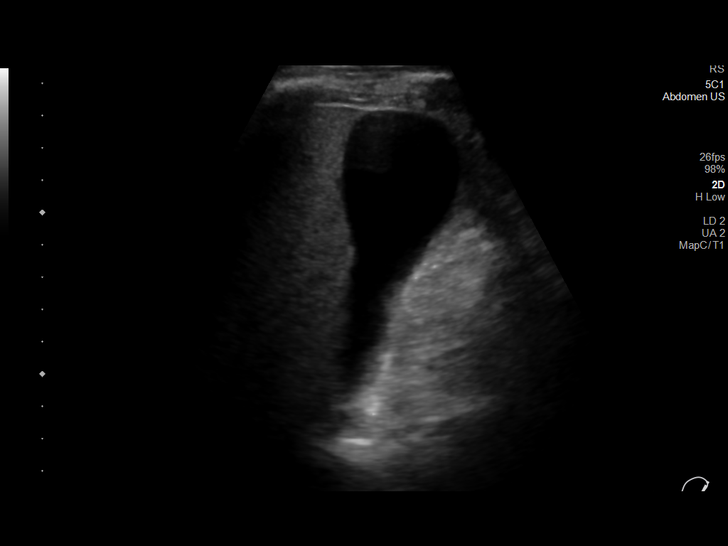
[im 18/29]
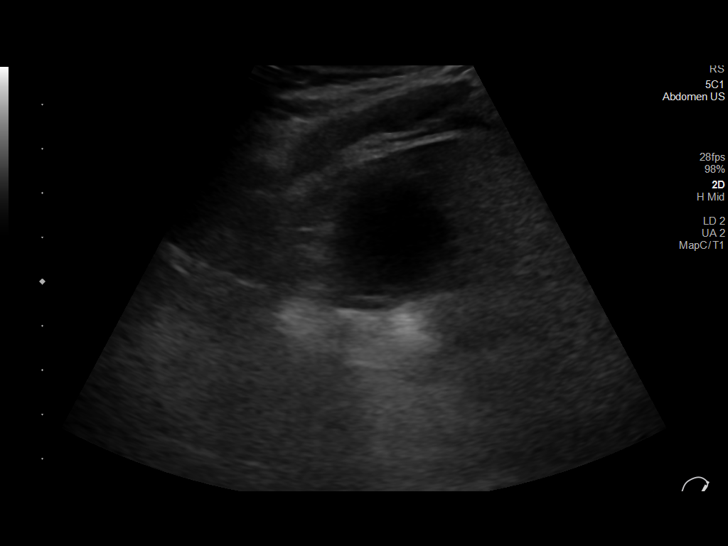
[im 19/29]
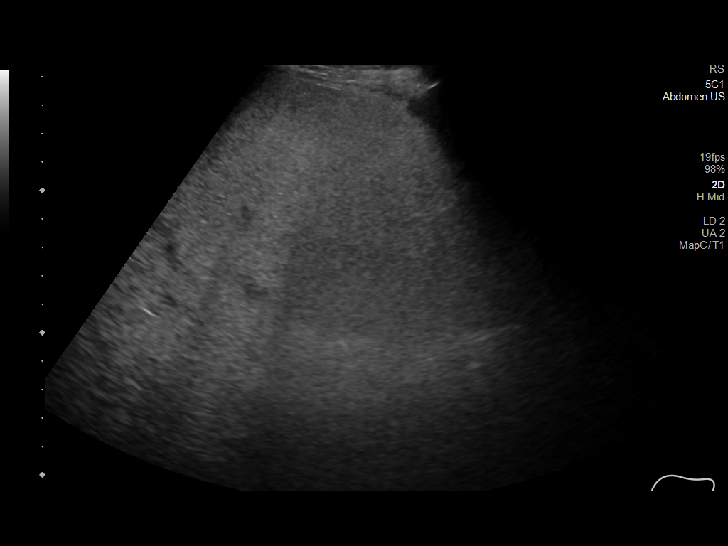
[im 22/29]
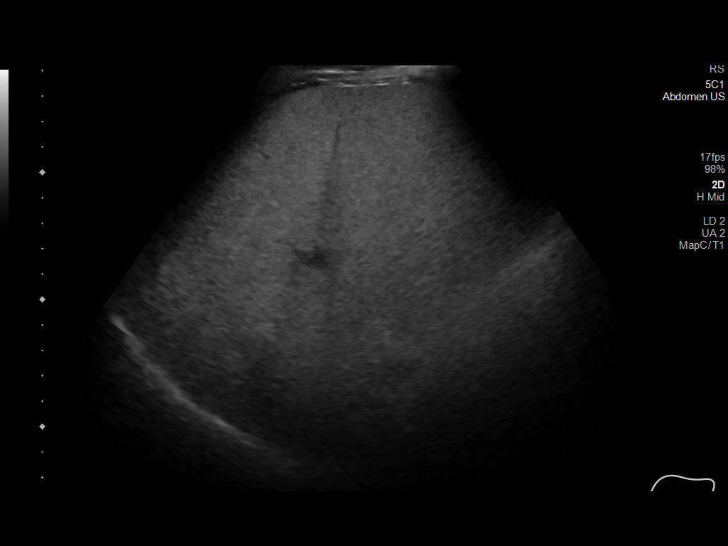
[im 24/29]
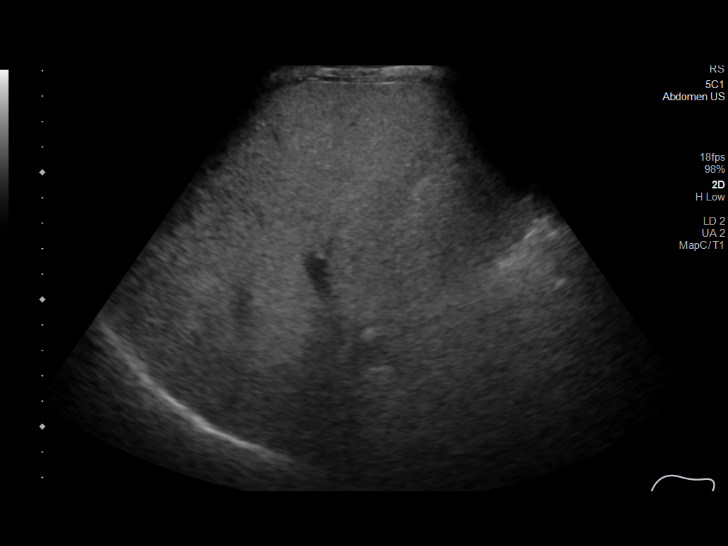
[im 26/29]
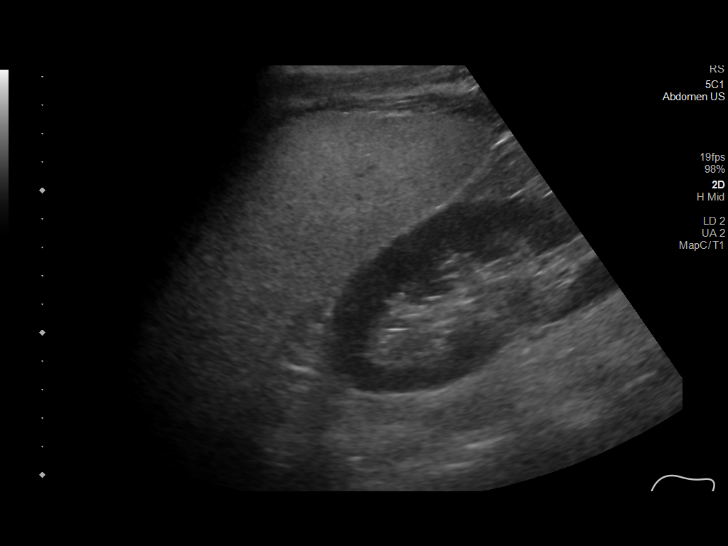
[im 29/29]
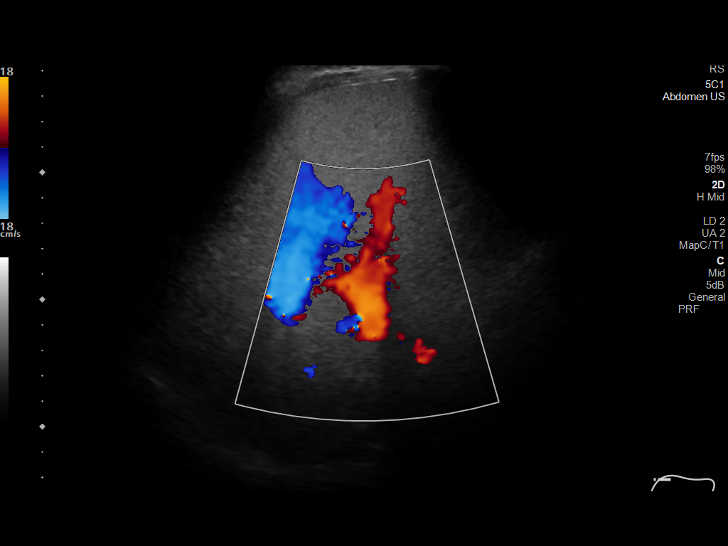

[14 of 25 positions shown; findings below may reference images not displayed]

FINDINGS: Gallbladder:

No gallstones or wall thickening visualized (2.3 mm). No sonographic
Murphy sign noted by sonographer.

Common bile duct:

The common bile duct is not visualized.

Liver:

No focal lesion identified. The liver parenchyma is nodular in
echotexture and diffusely increased in echogenicity. Portal vein is
patent on color Doppler imaging with normal direction of blood flow
towards the liver.

Other: The study is limited secondary to patient motion.
IMPRESSION: 1. Findings suggestive of hepatic steatosis and liver cirrhosis.

## 2022-01-17 DIAGNOSIS — F411 Generalized anxiety disorder: Secondary | ICD-10-CM | POA: Diagnosis not present

## 2022-01-17 DIAGNOSIS — G4700 Insomnia, unspecified: Secondary | ICD-10-CM | POA: Diagnosis not present

## 2022-01-17 DIAGNOSIS — L729 Follicular cyst of the skin and subcutaneous tissue, unspecified: Secondary | ICD-10-CM | POA: Diagnosis not present

## 2022-02-07 DIAGNOSIS — F411 Generalized anxiety disorder: Secondary | ICD-10-CM | POA: Diagnosis not present

## 2022-02-07 DIAGNOSIS — F321 Major depressive disorder, single episode, moderate: Secondary | ICD-10-CM | POA: Diagnosis not present

## 2022-02-07 DIAGNOSIS — G479 Sleep disorder, unspecified: Secondary | ICD-10-CM | POA: Diagnosis not present

## 2022-03-14 DIAGNOSIS — D485 Neoplasm of uncertain behavior of skin: Secondary | ICD-10-CM | POA: Diagnosis not present

## 2022-03-14 DIAGNOSIS — L821 Other seborrheic keratosis: Secondary | ICD-10-CM | POA: Diagnosis not present

## 2022-03-14 DIAGNOSIS — L578 Other skin changes due to chronic exposure to nonionizing radiation: Secondary | ICD-10-CM | POA: Diagnosis not present

## 2022-03-14 DIAGNOSIS — D225 Melanocytic nevi of trunk: Secondary | ICD-10-CM | POA: Diagnosis not present

## 2022-03-14 DIAGNOSIS — L720 Epidermal cyst: Secondary | ICD-10-CM | POA: Diagnosis not present

## 2022-03-14 DIAGNOSIS — L570 Actinic keratosis: Secondary | ICD-10-CM | POA: Diagnosis not present

## 2022-03-18 DIAGNOSIS — L720 Epidermal cyst: Secondary | ICD-10-CM | POA: Diagnosis not present

## 2022-03-19 DIAGNOSIS — F411 Generalized anxiety disorder: Secondary | ICD-10-CM | POA: Diagnosis not present

## 2022-03-19 DIAGNOSIS — G4700 Insomnia, unspecified: Secondary | ICD-10-CM | POA: Diagnosis not present

## 2022-06-11 DIAGNOSIS — D696 Thrombocytopenia, unspecified: Secondary | ICD-10-CM | POA: Diagnosis not present

## 2022-06-11 DIAGNOSIS — G479 Sleep disorder, unspecified: Secondary | ICD-10-CM | POA: Diagnosis not present

## 2022-06-11 DIAGNOSIS — Z0000 Encounter for general adult medical examination without abnormal findings: Secondary | ICD-10-CM | POA: Diagnosis not present

## 2022-06-11 DIAGNOSIS — Z13220 Encounter for screening for lipoid disorders: Secondary | ICD-10-CM | POA: Diagnosis not present

## 2022-06-11 DIAGNOSIS — R632 Polyphagia: Secondary | ICD-10-CM | POA: Diagnosis not present

## 2023-01-06 DIAGNOSIS — G479 Sleep disorder, unspecified: Secondary | ICD-10-CM | POA: Diagnosis not present

## 2023-01-06 DIAGNOSIS — F1520 Other stimulant dependence, uncomplicated: Secondary | ICD-10-CM | POA: Diagnosis not present

## 2023-06-23 DIAGNOSIS — E782 Mixed hyperlipidemia: Secondary | ICD-10-CM | POA: Diagnosis not present

## 2023-06-23 DIAGNOSIS — Z0000 Encounter for general adult medical examination without abnormal findings: Secondary | ICD-10-CM | POA: Diagnosis not present

## 2023-06-23 DIAGNOSIS — G4700 Insomnia, unspecified: Secondary | ICD-10-CM | POA: Diagnosis not present

## 2023-06-23 DIAGNOSIS — Z125 Encounter for screening for malignant neoplasm of prostate: Secondary | ICD-10-CM | POA: Diagnosis not present

## 2023-10-15 DIAGNOSIS — Z1211 Encounter for screening for malignant neoplasm of colon: Secondary | ICD-10-CM | POA: Diagnosis not present

## 2023-12-30 DIAGNOSIS — G43909 Migraine, unspecified, not intractable, without status migrainosus: Secondary | ICD-10-CM | POA: Diagnosis not present

## 2024-07-05 DIAGNOSIS — G4700 Insomnia, unspecified: Secondary | ICD-10-CM | POA: Diagnosis not present

## 2024-07-05 DIAGNOSIS — F1011 Alcohol abuse, in remission: Secondary | ICD-10-CM | POA: Diagnosis not present

## 2024-07-05 DIAGNOSIS — E782 Mixed hyperlipidemia: Secondary | ICD-10-CM | POA: Diagnosis not present

## 2024-07-05 DIAGNOSIS — Z0000 Encounter for general adult medical examination without abnormal findings: Secondary | ICD-10-CM | POA: Diagnosis not present

## 2024-07-05 DIAGNOSIS — Z125 Encounter for screening for malignant neoplasm of prostate: Secondary | ICD-10-CM | POA: Diagnosis not present
